# Patient Record
Sex: Female | Born: 1955
Health system: Southern US, Community
[De-identification: ages and names within clinical notes are randomized; demographics above are authoritative.]

## PROBLEM LIST (undated history)

## (undated) DIAGNOSIS — I499 Cardiac arrhythmia, unspecified: Secondary | ICD-10-CM

## (undated) DIAGNOSIS — K219 Gastro-esophageal reflux disease without esophagitis: Secondary | ICD-10-CM

## (undated) DIAGNOSIS — F329 Major depressive disorder, single episode, unspecified: Secondary | ICD-10-CM

## (undated) DIAGNOSIS — Z9889 Other specified postprocedural states: Secondary | ICD-10-CM

## (undated) DIAGNOSIS — F410 Panic disorder [episodic paroxysmal anxiety] without agoraphobia: Secondary | ICD-10-CM

## (undated) DIAGNOSIS — E782 Mixed hyperlipidemia: Secondary | ICD-10-CM

## (undated) DIAGNOSIS — T8859XA Other complications of anesthesia, initial encounter: Secondary | ICD-10-CM

## (undated) DIAGNOSIS — R112 Nausea with vomiting, unspecified: Secondary | ICD-10-CM

## (undated) DIAGNOSIS — T4145XA Adverse effect of unspecified anesthetic, initial encounter: Secondary | ICD-10-CM

## (undated) DIAGNOSIS — D6861 Antiphospholipid syndrome: Secondary | ICD-10-CM

## (undated) DIAGNOSIS — I4891 Unspecified atrial fibrillation: Secondary | ICD-10-CM

## (undated) DIAGNOSIS — M159 Polyosteoarthritis, unspecified: Secondary | ICD-10-CM

## (undated) DIAGNOSIS — I480 Paroxysmal atrial fibrillation: Secondary | ICD-10-CM

## (undated) HISTORY — DX: Unspecified atrial fibrillation: I48.91

## (undated) HISTORY — DX: Gastro-esophageal reflux disease without esophagitis: K21.9

## (undated) HISTORY — DX: Mixed hyperlipidemia: E78.2

## (undated) HISTORY — DX: Paroxysmal atrial fibrillation: I48.0

## (undated) HISTORY — PX: TONSILLECTOMY: SUR1361

## (undated) HISTORY — DX: Polyosteoarthritis, unspecified: M15.9

## (undated) HISTORY — DX: Major depressive disorder, single episode, unspecified: F32.9

## (undated) HISTORY — DX: Panic disorder (episodic paroxysmal anxiety): F41.0

## (undated) HISTORY — PX: DIAGNOSTIC LAPAROSCOPY: SUR761

## (undated) HISTORY — PX: TOTAL ABDOMINAL HYSTERECTOMY W/ BILATERAL SALPINGOOPHORECTOMY: SHX83

## (undated) HISTORY — DX: Antiphospholipid syndrome: D68.61

---

## 1999-02-24 ENCOUNTER — Ambulatory Visit (HOSPITAL_COMMUNITY): Admission: AD | Admit: 1999-02-24 | Discharge: 1999-02-24 | Payer: Self-pay | Admitting: *Deleted

## 1999-02-25 ENCOUNTER — Encounter (INDEPENDENT_AMBULATORY_CARE_PROVIDER_SITE_OTHER): Payer: Self-pay | Admitting: Specialist

## 1999-04-06 ENCOUNTER — Other Ambulatory Visit: Admission: RE | Admit: 1999-04-06 | Discharge: 1999-04-06 | Payer: Self-pay | Admitting: *Deleted

## 1999-05-08 ENCOUNTER — Encounter: Payer: Self-pay | Admitting: Family Medicine

## 1999-05-08 ENCOUNTER — Encounter: Admission: RE | Admit: 1999-05-08 | Discharge: 1999-05-08 | Payer: Self-pay | Admitting: Family Medicine

## 1999-12-25 ENCOUNTER — Emergency Department (HOSPITAL_COMMUNITY): Admission: EM | Admit: 1999-12-25 | Discharge: 1999-12-25 | Payer: Self-pay | Admitting: Emergency Medicine

## 2000-04-04 ENCOUNTER — Encounter: Payer: Self-pay | Admitting: Family Medicine

## 2000-04-04 ENCOUNTER — Encounter: Admission: RE | Admit: 2000-04-04 | Discharge: 2000-04-04 | Payer: Self-pay | Admitting: Family Medicine

## 2000-05-23 ENCOUNTER — Encounter: Payer: Self-pay | Admitting: Family Medicine

## 2000-05-23 ENCOUNTER — Encounter: Admission: RE | Admit: 2000-05-23 | Discharge: 2000-05-23 | Payer: Self-pay | Admitting: Family Medicine

## 2000-07-04 ENCOUNTER — Ambulatory Visit (HOSPITAL_COMMUNITY): Admission: RE | Admit: 2000-07-04 | Discharge: 2000-07-04 | Payer: Self-pay | Admitting: Neurology

## 2000-08-06 ENCOUNTER — Ambulatory Visit (HOSPITAL_COMMUNITY): Admission: RE | Admit: 2000-08-06 | Discharge: 2000-08-06 | Payer: Self-pay | Admitting: *Deleted

## 2000-11-04 ENCOUNTER — Encounter: Payer: Self-pay | Admitting: Neurology

## 2000-11-04 ENCOUNTER — Encounter: Admission: RE | Admit: 2000-11-04 | Discharge: 2000-11-04 | Payer: Self-pay | Admitting: Neurology

## 2001-03-18 ENCOUNTER — Encounter: Payer: Self-pay | Admitting: Neurology

## 2001-03-18 ENCOUNTER — Encounter: Admission: RE | Admit: 2001-03-18 | Discharge: 2001-03-18 | Payer: Self-pay | Admitting: Neurology

## 2001-04-07 ENCOUNTER — Encounter: Admission: RE | Admit: 2001-04-07 | Discharge: 2001-04-07 | Payer: Self-pay | Admitting: Family Medicine

## 2001-04-07 ENCOUNTER — Encounter: Payer: Self-pay | Admitting: Family Medicine

## 2001-04-07 ENCOUNTER — Other Ambulatory Visit: Admission: RE | Admit: 2001-04-07 | Discharge: 2001-04-07 | Payer: Self-pay | Admitting: Family Medicine

## 2003-02-04 ENCOUNTER — Encounter: Admission: RE | Admit: 2003-02-04 | Discharge: 2003-02-04 | Payer: Self-pay | Admitting: Family Medicine

## 2003-09-27 ENCOUNTER — Ambulatory Visit (HOSPITAL_COMMUNITY): Admission: RE | Admit: 2003-09-27 | Discharge: 2003-09-27 | Payer: Self-pay | Admitting: Obstetrics and Gynecology

## 2003-09-27 ENCOUNTER — Encounter (INDEPENDENT_AMBULATORY_CARE_PROVIDER_SITE_OTHER): Payer: Self-pay | Admitting: *Deleted

## 2004-02-07 ENCOUNTER — Encounter: Admission: RE | Admit: 2004-02-07 | Discharge: 2004-02-07 | Payer: Self-pay | Admitting: Family Medicine

## 2004-06-19 ENCOUNTER — Encounter (INDEPENDENT_AMBULATORY_CARE_PROVIDER_SITE_OTHER): Payer: Self-pay | Admitting: *Deleted

## 2004-06-19 ENCOUNTER — Inpatient Hospital Stay (HOSPITAL_COMMUNITY): Admission: RE | Admit: 2004-06-19 | Discharge: 2004-06-21 | Payer: Self-pay | Admitting: Obstetrics and Gynecology

## 2005-03-07 ENCOUNTER — Encounter: Admission: RE | Admit: 2005-03-07 | Discharge: 2005-03-07 | Payer: Self-pay | Admitting: Family Medicine

## 2006-04-08 ENCOUNTER — Encounter: Admission: RE | Admit: 2006-04-08 | Discharge: 2006-04-08 | Payer: Self-pay | Admitting: Family Medicine

## 2008-03-16 ENCOUNTER — Encounter: Admission: RE | Admit: 2008-03-16 | Discharge: 2008-03-16 | Payer: Self-pay | Admitting: Family Medicine

## 2009-10-10 ENCOUNTER — Encounter: Admission: RE | Admit: 2009-10-10 | Discharge: 2009-10-10 | Payer: Self-pay | Admitting: Family Medicine

## 2010-07-07 NOTE — H&P (Signed)
[place]. [facility]  Patient:    Catherine [name], Catherine [name]                       MRN: [id number] Adm. Date:  [id number] Disc. Date: [id number] Attending:  Fenton [name] Dictator:   Catherine [name], N.P. CC:         Catherine [name], M.D.  Catherine [name], M.D.   History and Physical  PRIMARY CARE Catherine [name]:  Catherine [name], M.D.  DATE OF BIRTH:  October 07, 1955  IMPRESSION:  (As dictated by Dr. Hillary [name]) 1. Atypical chest pain in this 55 year old female with minimal risk factors    for coronary artery disease.  Followup stress Cardiolite July 31, 2000 was    suspicious for ischemia, anterior/anteroapical region, which may or may not    be related to a smaller Cardiolite dose she had had at rest.  She had    normal wall motion with ejection fraction of 57%.  Test results were    discussed with the patient and it was elected to proceed for coronary    angiography to ascertain if events of coronary artery disease as etiology    for discomfort. 2. Neuropathy; followed by Dr. Thad [name].  Uncertain etiology. 3. History of gastroesophageal reflux disease.  PLAN:  (As dictated by Dr. Hillary [name]) Coronary angiography, possible ______ intervention if indicated and able.  Risks, potential complications, benefits, and alternatives to procedure were discussed in detail.  Ms. Catherine [name] indicates her questions and concerns have been addressed and is agreeable to proceed.  HISTORY OF PRESENT ILLNESS:  Ms. Catherine [name] is a very pleasant 55 year old female with an approximately three-week history of left anterior, sharp, pinprick chest discomfort not particularly associated with exertion.  She had a single episode of feeling of facial fullness quickly extending down to her toes the evening of May 28 with associated near-syncopal feeling with subsequent anterior chest squeezing when she woke up the next morning.  Followup office evaluation with Dr. Fraser [name] May 29  resulting in referral for stress Cardiolite, which was suspicious for anterior/anteroapical ischemia, though this may or may not be related to a smaller Cardiolite dose she had received at rest.   All things considered, and after discussion with the patient, it was decided to proceed for coronary angiography.  PAST SURGICAL HISTORY:  Fibroids removed February 2001.  MEDICATIONS:  Neurontin 100 mg p.o. q.d. for the last two days with intention to titrate up per Dr. Thad [name].  She discontinued her klonpin about three weeks earlier secondary to thinking that this may be causing some weakness.  ALLERGIES:  CIPRO, CODEINE, and more recently, LACTOSE intolerance.  No problems with seafood, shellfish, no with ______.  SOCIAL HISTORY/HABITS:  Tobacco:  Negative.  ETOH:  Negative.  The patient is married.  Her husband is currently in prison since 1988.  This is her second marriage.  She has a 28 year old daughter.  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  FAMILY HISTORY:  Two brothers with hypertension and one with dyslipidemia. Parents without CAD.  REVIEW OF SYSTEMS:  As in the HPI/past medical history.  Otherwise, significant for fatigue and insomnia, ringing in her ears, noting more shortness of breath over the last two weeks exacerbated with recumbency.  She is undergoing current workup by a neurologist for generalized weakness and tingling; workup including possible etiology related to multiple sclerosis. The patient denies history of hypertension, dyslipidemia, diabetes, cancer, peptic  ulcer disease.  She does have problems with GERD when eating acidic or spicy foods or drinks.  Feelings of a lot of belching.  Denies melena, bright red blood PR, constipation or diarrhea.  Negative dysuria nor hematuria.  PHYSICAL EXAMINATION:  (As performed by Dr. Hillary [name])   VITAL SIGNS:  Blood pressure 104/72, heart rate 91 and regular, respiratory rate 15, temperature  97.3.  GENERAL:  She is a well-nourished, slender 55 year old female in no acute distress.  Her mother is in attendance today.  The patients height is 5 feet 6 inches, weight 140 pounds.  HEENT:  Brisk bilateral carotid upstroke without bruits.  NECK:  No JVD, no thyromegaly.  CHEST:  Lung sounds clear with equal bilateral excursion.  Negative CPA tenderness.  CARDIAC:  Regular rate and rhythm without murmur, rub, nor gallop.  Normal S1 and S2.  ABDOMEN:  Soft, nondistended, normoactive bowel sounds.  Negative abdominal aorta, renal, nor femoral bruits.  EXTREMITIES:  Distal pulses intact.  Negative pedal edema.  NEUROLOGIC:  Cranial nerves II-XII grossly intact.  Alert and oriented x 3.  GENITALIA/RECTUM:  Deferred.  LABORATORY DATA:  Negative.  From August 02, 2000:  Glucose 99, BUN 11, creatinine 0.8, sodium 140, potassium 4.0, chloride 103, CO2 27, calcium 9.1. LFTs within normal range.  Hemoglobin 12.8, hematocrit 37.1, platelets 319, WBC 4.8.  Coags:  Pro time 11.5 with INR of 0.95.  PTT of 29.  Stress Cardiolite from July 31, 2000 revealed reversed distribution in the anterior/anteroapical region, which may be related to smaller Cardiolite dose at rest.  Normal LV function with EF of 57%.  EKG from June 12 revealed NSR at 95 beats per minute without ischemic changes. DD:  08/06/00 TD:  08/06/00 Job: [id number]

## 2010-07-07 NOTE — Procedures (Signed)
[place]. [facility]  Patient:    REMINGTYN, DEPAOLA                       MRN: [id number] Proc. Date: 07/04/00 Adm. Date:  [id number] Attending:  Fenton Malling                           Procedure Report  PROCEDURE PERFORMED:  Diagnostic lumbar puncture.  OPERATOR:  Kelli Hope, M.D.  INDICATIONS FOR PROCEDURE:  Rule out multiple sclerosis.  DESCRIPTION OF PROCEDURE:  Informed consent was obtained after indications, risks, benefits and options were discussed with the patient and she agreed to proceed.  The patient was placed in the right lateral decubitus position and prepped and draped in the usual sterile fashion.  Local anesthesia was achieved with 2 cc of lidocaine.  A 20 gauge spinal needle was inserted into the L3-4 interspace and advanced until clear CSF was returned.  Opening pressure was measured at 165 mmH2O.  Approximately 8 tubes of spinal fluid were drawn off and sent for the following studies.  (1) Cell count and differential, (2) glucose and protein (3) immunoelectrophoresis and oligoclonal bands (4) hold.  Closing pressure was measured at 145 ccH2O. Needle was withdrawn.  Hemostasis was obtained.  No immediate complications were noted.  Patient was advised to lie flat for one hour prior to discharge. One red tube of blood will be sent the CSF to the lab for oligoclonal testing. DD:  07/04/00 TD:  07/05/00 Job: [id number]

## 2010-07-07 NOTE — H&P (Signed)
Catherine [name], Catherine [name]                ACCOUNT NO.:  [id number]   MEDICAL RECORD NO.:  [id number]          PATIENT TYPE:  INP   LOCATION:  NA                            [facility]   PHYSICIAN:  Charles A. Delcambre, MDDATE OF BIRTH:  11-28-1955   DATE OF ADMISSION:  DATE OF DISCHARGE:                                HISTORY & PHYSICAL   REASON FOR ADMISSION:  To undergo transabdominal hysterectomy and bilateral  salpingo-oophorectomy for recurrent endometrial polyps and abnormal uterine  bleeding.  Endometrial polyps have been hyperplastic without atypia but have  been recurrent very rapidly after hysteroscopic resection of the polyps  noted to be complete with hysteroscopy.  Hysteroscopic resection was done on  09/2003 and now further workup for abnormal bleeding once again in 04/2004  has noted multiple polyps to have reoccurred.  She is now to be admitted to  undergo definitive therapy with this history of hyperplastic polyps.  She is  a 55 year old, para 1-0-0-1, LMP one week ago, periods very heavy lasting 7-  8 days trying to cycle on Prometrium but failing this, still being irregular  and heavy.  She gives informed consent except for some infection, bleeding,  bowel or bladder damage, ureteral damage, blood product risks including  hepatitis and HIV exposure, DVT risks, anesthesia risks, and general  incisional infection as well as fistula formation.  All questions were  answered.  We will proceed as outlined.   PAST MEDICAL HISTORY:  Anxiety, antiphospholipid syndrome, TIA versus MS  versus anxiety with lesion seen on her brain on MRI but symptoms of nerve  shaking in her extremities are all much better on Lexapro.   PAST SURGICAL HISTORY:  History of fibroid removal hysteroscopically in  2000, bilateral tubal ligation, tonsils and adenoids.   MEDICATIONS:  Lexapro 10 mg once a day, Prometrium discontinued at this  time, baby aspirin 81 mg once that was discontinued one  week ago.   ALLERGIES:  Codeine and ciprofloxacin reaction not specified.   SOCIAL HISTORY:  No tobacco, ethanol or drug use.  The patient is married,  in a monogamous relationship with her husband.   FAMILY HISTORY:  Hypercholesterolemia in her father, brother with  hypercholesterolemia and hypertension also, a secondary brother with  hypertension, otherwise negative family history.   REVIEW OF SYSTEMS:  Denies fevers or chills, rashes, lesions, headaches,  dizziness, chest pain, shortness of breath, wheezing, diarrhea,  constipation, bleeding, melena, hematochezia, urgency, frequency, dysuria,  incontinence, hematuria, galacturia, emotional changes.   PHYSICAL EXAMINATION:  Alert and oriented x3, in no distress.  Blood  pressure 100/64, heart rate 64, weight 145 pounds.  HEENT:  Exam grossly  within normal limits.  NECK:  Supple, without thyromegaly.  No adenopathy.  LUNGS:  Clear bilaterally.  BACK:  No CVAT.  HEART:  Regular rate and rhythm  without murmur, rub or gallop.  BREASTS:  Symmetrical, otherwise not  examined at this time.  ABDOMEN:  Soft, flat, non-tender.  No  hepatosplenomegaly.  PELVIC:  Normal external female genitalia.  Bartholin,  urethral, Skene glands normal.  Vagina without discharge  or lesions.  Normal  cervix.  Uterus is 8-10 weeks size, mobile, and non-tender.  Adnexa non-  tender without masses bilaterally.  EXTREMITIES:  Non-tender.  Anus and  peritoneal body appear normal.  RECTAL:  Exam is not done.   ASSESSMENT:  Recurrent endometrial polyps.   PLAN:  Transabdominal hysterectomy, bilateral salpingo-oophorectomy with  inspection of the pelvis carefully.  Preoperative CBC, chem-20, type and screen, Cefotan or equivalent 1 g on  call to the OR, knee high SCDs.  Plan Lovenox 24 hours after surgery for 3-4  days until fully mobile, then restart one aspirin per day secondary to  antiphospholipid syndrome. All questions were answered and will proceed as   outlined.      CAD/MEDQ  D:  06/14/2004  T:  06/14/2004  Job:  [id number]

## 2010-07-07 NOTE — H&P (Signed)
Catherine [name], [name]                          ACCOUNT NO.:  [id number]   MEDICAL RECORD NO.:  [id number]                   PATIENT TYPE:  AMB   LOCATION:  SDC                                  [facility]   PHYSICIAN:  Charles A. Sydnee Cabal, MD            DATE OF BIRTH:  1955-06-12   DATE OF ADMISSION:  09/13/2003  DATE OF DISCHARGE:                                HISTORY & PHYSICAL   The patient comes in to be admitted today for hysteroscopy and dilation and  curettage for a non endometrial polyp and menorrhagia.  A 55 year old para 1-  0-0-1, LMP irregular Jul 15, 2003, very heavy.  It is lasting three weeks,  then nothing for a month and then lasting two weeks and then nothing for a  month, skipping a month, etc.  Endometrial biopsy was done, benign, June of  2005.  Hemoglobin at this time 11.0, hematocrit 32.9, platelet count  379,000. Sonohistogram was done showing an endometrial polyp just under 2  cm.  This done August 27, 2003.  She is then to be admitted to undergo  hysteroscopy and dilation and curettage.  She accepts the risks of  infection, bleeding, bowel and bladder damage, blood product  risks  including hepatitis and HIV exposure, uterine perforation risks including  overload risks.   PAST MEDICAL HISTORY:  1. Anxiety.  2. Antiphospholipid syndrome.  3. ?  Transient ischemic attack versus MS versus anxiety with lesions on     her brain and symptoms of nerve shaking in extremities.   PAST SURGICAL HISTORY:  1. History of fibroid removal hysteroscopically in 2000.  2. Bilateral tubal ligation.  3. Tonsils and adenoids.   ADMISSION MEDICATIONS:  1. Lexapro 10 mg a day.  2. Baby aspirin 80 mg a day, discontinue today.   ALLERGIES:  CODEINE and CIPROFLOXIN.  Reaction not specified.   SOCIAL HISTORY:  Denies tobacco, ethanol or drug use, or STD exposure in the  past.  The patient is married in monogamous relationship with her husband.   FAMILY HISTORY:   Hypercholesterolemia in her father.  One brother has  hypercholesterolemia and hypertension also.  A second brother has  hypertension.  Otherwise negative family history.   REVIEW OF SYSTEMS:  She does complain of menorrhagia.  Otherwise no fevers,  chills, rashes, lesions, headaches, dizziness, chest pain, shortness of  breath, wheezing, diarrhea, constipation, bleeding, melena, hematochezia,  urgency, frequency, dysuria, incontinence, hematuria, galactorrhea,  emotional changes.   PHYSICAL EXAMINATION:  VITAL SIGNS:  Blood pressure 114/70, weight 139  pounds, height 5 feet 6 inches, respiratory rate 16, pulse 90, afebrile,  GENERAL:  Alert and oriented times 3, no distress.  HEENT:  Grossly within normal limits.  NECK:  Supple without thyromegaly or adenopathy.  LUNGS:  Clear bilaterally.  HEART:  Regular rate and rhythm without murmurs, rubs or gallops.  BREASTS:  Deferred.  Normal breast exam with PCP  recently per patient's  history.  ABDOMEN:  Soft, flat, nontender, no hepatosplenomegaly or other masses  noted.  PELVIC:  Normal external female genitalia.  Bartholins, urethra and Skenes  were within normal limits.  Vault without discharge or lesions.  At time of  biopsy, sound was to 8 cm.  Bimanual examination uterus retroflexed, non  enlarged, mobile and nontender.  Adnexa were nontender without masses,  bilateral ovaries were felt to be normal size bilaterally.   ASSESSMENT:  1. Menorrhagia.  2. Endometrial polyp.   PLAN:  Hysteroscopy and D&C as noted above.   PREOP:  Serum  HCG, CBC, n.p.o. past 0700.  Clears up until that time and  will proceed as outlined.                                               Charles A. Sydnee Cabal, MD    CAD/MEDQ  D:  09/13/2003  T:  09/13/2003  Job:  [id number]

## 2010-07-07 NOTE — Cardiovascular Report (Signed)
[place]. [facility]  Patient:    Catherine [name], Catherine [name]                       MRN: [id number] Adm. Date:  [id number] Disc. Date: [id number] Attending:  Fenton Malling CC:         Desma Maxim, M.D.   Cardiac Catheterization  REFERRING PHYSICIAN:  Desma Maxim, M.D.  INDICATIONS:  This is a 55 year old female with ongoing chest pain associated with palpitations and tachy arrhythmias.  Stress test was performed.  The patient exercised to 7 minutes and 24 seconds utilizing full Bruce protocol.  Baseline ECG revealed normal sinus rhythm, with T-wave inversion in the inferolateral lead.  ECG at peak revealed sinus tachycardia with no inducible ischemia.  Cardiolite revealed reversary distribution in the anterior interior apical region, which was felt to be secondary to breast attenuation and a small dose used on rest images.  The _____ study revealed normal wall motion with an ejection fraction of 57%. These findings were discussed with the patient.  She continued to have ongoing chest pain and wished to have coronary angiography.  PROCEDURE:  After obtaining written informed consent, the patient was brought to the cardiac catheterization laboratory in the postabsorptive state. Preoperative sedation was achieved using Valium p.o.  The right groin was prepped and draped in the usual sterile fashion.  Local anesthesia was achieved using 1% Xylocaine.  IV sedation was performed using Versed 2 mg IV.  A 6-French hemostasis sheath was placed into the right femoral artery using the modified Seldinger technique.  Selective coronary angiography was performed using JL-4 and JR-5 Judkins catheters.  Multiple views of the left system were obtained.  A single view of the right system was obtained secondary to a small codominant artery and significant dampening with engagement.  All catheter exchanges were made over a guide wire.  The hemostasis sheath was  flushed following each engagement.  FINDINGS: The aortic pressure is 110/71.  LV pressure is 111/8.  EDP is 18.  Single-plane ventriculogram revealed normal wall motion with an ejection fraction of 65%.  There was no mitral regurgitation noted.  CORONARY ANGIOGRAPHY: The left main coronary artery was short and bifurcated into the left anterior descending and circumflex.  There was no significant disease in the left main coronary artery.  The left anterior descending gave rise to a large D-1, large D-2, small D-3, and ended as an apical recurrent branch.  There was no significant disease in the left anterior descending or its branches.  The circumflex vessel was large and codominant.  It gave rise to a large OM-1, small OM-2, large OM-3, and a PDA branch.  There was no significant disease in the circumflex or its branches.  The right coronary artery was small and codominant.  It gave rise to an RV marginal #1 and RV marginal #2.  IMPRESSION: 1. Normal coronary angiography. 2. Normal single-plane ventriculogram.  RECOMMENDATION:  Consider other etiologies for her chest pain. DD:  08/06/00 TD:  08/06/00 Job: [id number]

## 2010-07-07 NOTE — Op Note (Signed)
[name], [name]                ACCOUNT NO.:  [id number]   MEDICAL RECORD NO.:  [id number]          PATIENT TYPE:  INP   LOCATION:  9399                          [facility]ILITY:  [facility]   PHYSICIAN:  Charles A. Delcambre, MDDATE OF BIRTH:  1955/12/10   DATE OF PROCEDURE:  06/19/2004  DATE OF DISCHARGE:                                 OPERATIVE REPORT   PREOPERATIVE DIAGNOSIS:  Endometrial hyperplastic polyps, recurrent.   POSTOPERATIVE DIAGNOSIS:  Endometrial hyperplastic polyps, recurrent.   OPERATION/PROCEDURE:  1.  Transabdominal hysterectomy.  2.  Bilateral salpingo-oophorectomy.   SURGEON:  Charles A. Sydnee Cabal, M.D.   ASSISTANT:  Artist Pais, M.D.   COMPLICATIONS:  None.   ESTIMATED BLOOD LOSS:  700 mL.   SPECIMENS:  Uterus, tubes and ovaries to pathology.   COUNTS:  Instrument, sponge and needle counts correct x2.   ANESTHESIA:  General endotracheal anesthesia.   OPERATIVE FINDINGS:  Some pelvic adhesive disease with bowel adherent to the  posterior aspect of the uterus and to the left tube and ovary in the pelvis.  Large bowel adhesions.  Some omental adhesions to the adnexal regions  consistent with tubal ligation in the past.   DESCRIPTION OF PROCEDURE:  The patient was taken to the operating room and  placed in the supine position.  General anesthetic was induced without  difficulty.  Sterile prep and drape was undertaken.  A Pfannenstiel incision  was made with the knife, carried down to the fascia.  The fascia was incised  with the knife and Mayo scissors and released superiorly and inferiorly  sharply.  Rectus muscles were sharply dissected in the midline.  The  peritoneum was entered with Metzenbaum scissors without damage to bowel and  bladder or vascular structures.  Peritoneum incision was extended.  Balfour  retractor was placed. Moistened laps were used to pack the bowel under the  pelvis.  Kelly clamps were used to grasp the uterus.  Round ligaments  were  transected and transfixed stitch of 0 Vicryl and held.  Bladder was dropped  down across the lower uterine segment bluntly and sharply, after incising  the vesicouterine peritoneum. Broad ligament was opened.  Ureters were seen  and well cleared with the infundibulum pedicle. These pedicles were taken,  free-tied and transfixing stitched with 0 Vicryl.  Hemostasis was excellent.  Uterine vessels were skeletonized after the bladder was taken down sharply  and bluntly.  Uterine vessels were simply  tied bilaterally.  The remaining  pedicles for the cardinal ligaments were taken down on either side.  A  moderate amount of bleeding was encountered.  With sharp dissection of the  bowel off the posterior aspect of the uterus and infundibulum pedicle and  skeletonized the uterine vessels on the left from the adhesions, accounting  for most of the blood loss.  Adequate capture of the bleeding was undertaken  and further pedicles were taken carefully without going lateral.  Final  pedicles on either side included vaginal angle and the uterosacral  ligaments.  These were transfixed with stay sutures and held.  Cervix was  amputated from  the vaginal cuff and Richardson angle sutures were placed  with 0 Vicryl and suspended to the uterosacral ligament.  Vaginal cuff was  then closed with 0 Vicryl running locking suture with good hemostasis  resulting.  Irrigation was carried out.  Bleeding area on the left  peritoneal edge at the cardinal ligament pedicle was stitched with figure-of-  eight suture superficially.  Hemostasis was excellent.  This again counted  again for approximately another 100 mL of blood loss.  Hemostasis was  verified to be excellent after irrigation.  Small bleeder at the right  uterosacral ligament was figure-of-eight sutured with 2-0 Vicryl achieving  adequate hemostasis.  This again was another 50 mL approximately of blood  loss.  Irrigation was carried out in all areas  with good hemostasis.  Laps  were removed.  Balfour retractor was removed.  Subfascial hemostasis was  excellent.  Fascia was closed with #1 Vicryl running non-locking suture.  Subcutaneous tissue hemostasis was excellent after minor electrocautery.  Sterile skin clips were used to closet skin.  Sterile dressing was applied.  The patient was taken to recovery in good condition,  having tolerated the  procedure well.      CAD/MEDQ  D:  06/19/2004  T:  06/19/2004  Job:  [id number]

## 2010-07-07 NOTE — Discharge Summary (Signed)
NAMEWINNI, EHRHARD                ACCOUNT NO.:  [id number]   MEDICAL RECORD NO.:  [id number]          PATIENT TYPE:  INP   LOCATION:  9316                          [facility]   PHYSICIAN:  Charles A. Delcambre, MDDATE OF BIRTH:  03/13/1955   DATE OF ADMISSION:  06/19/2004  DATE OF DISCHARGE:  06/21/2004                                 DISCHARGE SUMMARY   PRIMARY DISCHARGE DIAGNOSES:  1.  Benign endometrial polyps.  2.  Adenomyosis.  3.  Uterine leiomyomata.  4.  Hyperplastic polyps, recurrent short-term.  5.  Antiphospholipid syndrome.   PROCEDURE:  Transabdominal hysterectomy, bilateral salpingo-oophorectomy.   DISPOSITION:  The patient discharged home to follow up in the office in 2  days to have staples discontinued. She was to convalesce at home, notify of  temperature greater than 100 degrees, erythema or drainage from the  incision. No lifting greater than 25 pounds for 4 weeks, no driving for 2  weeks, shower for 2 weeks. She was given prescription Lovenox 40 mg to use  subcutaneously for 2 days and then go to aspirin once a day per PCP. Also,  Darvocet-N 100 one to two p.o. q.4h. p.r.n. #40 and Chromagen one p.o.  daily.   LABORATORY:  Postoperative hematocrit 26.2, hemoglobin 9.1.   HISTORY AND PHYSICAL:  As dictated on the chart.   HOSPITAL COURSE:  The patient was admitted and underwent surgery as noted  above. Postoperatively, the patient had no complications. She has  spontaneous flatus return postoperative day #1 and was given general diet.  She voided without difficulty after Foley catheter was discontinued  postoperative day #1. She ambulated without difficulty and as she was doing  well, Lovenox was started postoperative day #2 and she was discharged home  on postoperative day #2 with follow-up as noted above.      CAD/MEDQ  D:  07/20/2004  T:  07/20/2004  Job:  [id number]

## 2010-07-07 NOTE — Op Note (Signed)
Catherine [name], Catherine [name]                          ACCOUNT NO.:  [id number]   MEDICAL RECORD NO.:  [id number]                   PATIENT TYPE:  AMB   LOCATION:  SDC                                  FACILITY:  [facility]   PHYSICIAN:  Charles A. Sydnee Cabal, MD            DATE OF BIRTH:  1955-06-01   DATE OF PROCEDURE:  09/27/2003  DATE OF DISCHARGE:                                 OPERATIVE REPORT   PREOPERATIVE DIAGNOSE:  1. Abnormal uterine bleeding/menorrhagia.  2. Endometrial polyp.   POSTOPERATIVE DIAGNOSES:  1. Abnormal uterine bleeding/menorrhagia.  2. Endometrial polyp.   PROCEDURE:  1. Paracervical block.  2. Hysteroscopy.  3. Dilation and curettage.  4. Hysteroscopic polypectomy.   SURGEON:  Charles A. Delcambre, MD   COMPLICATIONS:  None,.   ESTIMATED BLOOD LOSS:  Less than or equal to 25 mL.   INS AND OUTS:  Loss of uterine distention medium sorbitol 20 mL.   COUNTS:  Instrument, sponge and needle count correct x2.   ANESTHESIA:  Monitored anesthesia care, IV sedation.   OPERATIVE FINDINGS:  Small fundal endometrial polyp.   SPECIMENS:  1. Endometrial polyps.  2. Endometrial curettings.   DESCRIPTION OF PROCEDURE:  The patient was taken to the operating room and  placed in the supine position.  Anesthesia was induced.  The dorsal  lithotomy position in the universal stirrups was undertaken.  The cervix was  grasped with a single-tooth tenaculum.  Paracervical block was placed,  divided equally at 4 and 8 o'clock.  Next, 0.25% plain Marcaine, 18 mL,  divided equally.  Hegar dilators were used to dilate her enough to pass the  small 5 mm hysteroscope.  Findings are as noted above.  Under great  visualization, the polyp was removed with banjo  forceps and visualization of the hysteroscope verifying.  Generous  curettings were then undertaken.  There was no evidence of perforation.  The  patient tolerated the procedure well.  All instruments were removed.  The  patient  was taken to the recovery with the assistant in attendance.                                               Charles A. Sydnee Cabal, MD    CAD/MEDQ  D:  09/27/2003  T:  09/27/2003  Job:  [id number]

## 2010-12-29 ENCOUNTER — Other Ambulatory Visit: Payer: Self-pay | Admitting: Family Medicine

## 2010-12-29 DIAGNOSIS — Z1231 Encounter for screening mammogram for malignant neoplasm of breast: Secondary | ICD-10-CM

## 2011-01-24 ENCOUNTER — Ambulatory Visit
Admission: RE | Admit: 2011-01-24 | Discharge: 2011-01-24 | Disposition: A | Discharge status: Home / Self Care | Payer: BC Managed Care – PPO | Source: Ambulatory Visit | Attending: Family Medicine | Admitting: Family Medicine

## 2011-01-24 DIAGNOSIS — Z1231 Encounter for screening mammogram for malignant neoplasm of breast: Secondary | ICD-10-CM

## 2012-03-04 ENCOUNTER — Other Ambulatory Visit: Payer: Self-pay | Admitting: Family Medicine

## 2012-03-04 DIAGNOSIS — Z1231 Encounter for screening mammogram for malignant neoplasm of breast: Secondary | ICD-10-CM

## 2012-03-27 ENCOUNTER — Ambulatory Visit
Admission: RE | Admit: 2012-03-27 | Discharge: 2012-03-27 | Disposition: A | Discharge status: Home / Self Care | Payer: BC Managed Care – PPO | Source: Ambulatory Visit | Attending: Family Medicine | Admitting: Family Medicine

## 2012-03-27 DIAGNOSIS — Z1231 Encounter for screening mammogram for malignant neoplasm of breast: Secondary | ICD-10-CM

## 2012-12-12 ENCOUNTER — Telehealth: Payer: Self-pay | Admitting: Interventional Cardiology

## 2012-12-12 NOTE — Telephone Encounter (Signed)
New Problem:  Pt states she has a-fib and after her recent trip to [place] it has gotten worse. Pt would like to see Dr. Eldridge Dace asap. I scheduled the pt to come in 11/5. Pt would like to be sen sooner. Pt states she took an extra dose of the generic for toprolol 25mg . Pt states she plans on taking two until she sees the doctor.  Please advise

## 2012-12-12 NOTE — Telephone Encounter (Signed)
Per Dr. Eldridge Dace pt should continue the increased dosage of Metoprolol 50mg . Pt should be seen next week. Appt made for 12/16/12. Pt is aware and meds have been updated.

## 2012-12-15 ENCOUNTER — Encounter: Payer: Self-pay | Admitting: Interventional Cardiology

## 2012-12-15 MED ORDER — METOPROLOL SUCCINATE ER 50 MG PO TB24: 50.0000 mg | ORAL_TABLET | Freq: Every day | ORAL | Status: DC

## 2012-12-16 ENCOUNTER — Encounter: Payer: Self-pay | Admitting: Interventional Cardiology

## 2012-12-16 ENCOUNTER — Ambulatory Visit (INDEPENDENT_AMBULATORY_CARE_PROVIDER_SITE_OTHER): Payer: BC Managed Care – PPO | Admitting: Interventional Cardiology

## 2012-12-16 VITALS — BP 120/82 | HR 73 | Ht 65.0 in | Wt 180.0 lb

## 2012-12-16 DIAGNOSIS — I480 Paroxysmal atrial fibrillation: Secondary | ICD-10-CM | POA: Insufficient documentation

## 2012-12-16 DIAGNOSIS — I4891 Unspecified atrial fibrillation: Secondary | ICD-10-CM

## 2012-12-16 DIAGNOSIS — R002 Palpitations: Secondary | ICD-10-CM

## 2012-12-16 DIAGNOSIS — E782 Mixed hyperlipidemia: Secondary | ICD-10-CM

## 2012-12-16 DIAGNOSIS — R079 Chest pain, unspecified: Secondary | ICD-10-CM

## 2012-12-16 MED ORDER — METOPROLOL SUCCINATE ER 25 MG PO TB24: 25.0000 mg | ORAL_TABLET | Freq: Two times a day (BID) | ORAL | Status: DC

## 2012-12-16 NOTE — Progress Notes (Signed)
Patient ID: Catherine [name], female   DOB: [birth year], 56 y.o.   MRN: [id number]    [address] [place], [place]  [address] Phone: [phone] Fax:  [phone]  Date:  12/16/2012   ID:  Catherine [name], DOB 11-Jul-1955, MRN [id number]  PCP:  Cam Hai, CNM      History of Present Illness: Catherine [name] is a 57 y.o. female  who has PAF. SHe has had minimal AFib until 11/30/12. No exertional chest pain. SHe does not do much exercise. She only walks her dog. Aspirin tolerated well. Still has caffeine. Increased tea/coffee consumption, which can cause some palpitations. Atrial Fibrillation F/U:  c/o Palpitations rare.  Denies : Chest pain.  Dizziness.  Leg edema.  Orthopnea.  Shortness of breath.  Syncope.    Since 11/30/12, while going to [place], she has had more palpitations- episodes lasting hours at a time.  Her metoprolol was increased and sx are better but not gone completely.  She has dut out caffeine and sx have not resolved.  It is an intermittent pulse.  Not as severe as the past AFib.  No syncope.   Sx more present in the evening. She has had some CP ,  Sharp pains lasting seconds.  None with exertion.  Stress test in 2002 negative. Walks regularly wihtout problems.    Wt Readings from Last 3 Encounters:  12/16/12 180 lb (81.647 kg)     Past Medical History  Diagnosis Date  . Panic disorder   . Anti-phospholipid syndrome   . Atrial fibrillation     on asa 325  . PAF (paroxysmal atrial fibrillation)   . Mixed hyperlipidemia   . GERD (gastroesophageal reflux disease)   . Generalized osteoarthrosis, unspecified site   . Major depression     Current Outpatient Prescriptions  Medication Sig Dispense Refill  . aspirin 325 MG tablet Take 325 mg by mouth daily.      Marland Kitchen buPROPion (WELLBUTRIN XL) 150 MG 24 hr tablet Take one tablet daily      . buPROPion (ZYBAN) 150 MG 12 hr tablet Take 150 mg by mouth daily.      . cholecalciferol (VITAMIN D) 1000  UNITS tablet Take 1,000 Units by mouth daily.      Tery Sanfilippo Sodium (PHILLIPS STOOL SOFTENER PO) Take by mouth daily.      . fish oil-omega-3 fatty acids 1000 MG capsule Take 2 g by mouth daily.      . metoprolol succinate (TOPROL-XL) 25 MG 24 hr tablet Take 25 mg by mouth daily.      Marland Kitchen omeprazole (PRILOSEC OTC) 20 MG tablet Take 20 mg by mouth daily.      Marland Kitchen OVER THE COUNTER MEDICATION Take one tablet of calcium and magnesium daily      . simvastatin (ZOCOR) 5 MG tablet Take 5 mg by mouth at bedtime.      Marland Kitchen venlafaxine XR (EFFEXOR-XR) 75 MG 24 hr capsule Take 75 mg by mouth 2 (two) times daily.       No current facility-administered medications for this visit.    Allergies:    Allergies  Allergen Reactions  . Ciprofloxacin Hcl   . Codeine   . Dairy Aid [Lactase]     Burns throat and makes pt. Head feel stuffy with headache  . Nsaids   . Other     Patient is allergic to nuts, it gives her headache     Social History:  The patient  reports that she has never smoked. She does not have any smokeless tobacco history on file. She reports that she does not drink alcohol or use illicit drugs.   Family History:  The patient's family history includes Cancer in her father, maternal grandfather, and maternal grandmother; Diabetes in her maternal grandmother; Hyperlipidemia in her brother and brother; Hypertension in her brother and brother.   ROS:  Please see the history of present illness.  No nausea, vomiting.  No fevers, chills.  No focal weakness.  No dysuria. palpitations   All other systems reviewed and negative.   PHYSICAL EXAM: VS:  BP 120/82  Pulse 73  Ht 5\' 5"  (1.651 m)  Wt 180 lb (81.647 kg)  BMI 29.95 kg/m2  SpO2 99% Well nourished, well developed, in no acute distress HEENT: normal Neck: no JVD, no carotid bruits Cardiac:  normal S1, S2; RRR;  Lungs:  clear to auscultation bilaterally, no wheezing, rhonchi or rales Abd: soft, nontender, no hepatomegaly Ext: no  edema Skin: warm and dry Neuro:   no focal abnormalities noted  EKG:  NSR. No ST segment  ASSESSMENT AND PLAN:  1. Atrial fibrillation  Continue Metoprolol Succinate Tablet Extended Release 24 Hour, 50 MG, 1 tablet, Orally, Once a day, 90, Refills 3 Continue Aspirin Tablet, 325 mg, 1 tablet, Orally, Once a day Diagnostic Imaging:EKG Harward,Amy 10/09/2011 03:44:41 PM > Hava Massingale,JAY 10/09/2011 04:31:58 PM > NSR, nonspecific ST changes  currently in sinus rhythm. Worsening palpitations. Plan or lifewatch monitor.    2. Mixed hyperlipidemia  Continue Simvastatin Tablet, 5 MG, 1 tablet every evening, Orally, Once a day Lipids well controlled.    3. Chest pain: Atypical features.  No ischemia on prior stress in 2002.   Preventive Medicine  Adult topics discussed:  Exercise: 5 days a week, at least 30 minutes of aerobic exercise.      Signed, Fredric Mare, MD, [facility] 12/16/2012 10:48 AM and and and had date with his symptoms have any evidence of more H. poor in her

## 2012-12-16 NOTE — Patient Instructions (Signed)
Your physician has recommended that you wear an event monitor. Event monitors are medical devices that record the heart's electrical activity. Doctors most often [place] these monitors to diagnose arrhythmias. Arrhythmias are problems with the speed or rhythm of the heartbeat. The monitor is a small, portable device. You can wear one while you do your normal daily activities. This is usually used to diagnose what is causing palpitations/syncope (passing out).  Your physician recommends that you follow up as scheduled.

## 2012-12-19 ENCOUNTER — Encounter: Payer: Self-pay | Admitting: *Deleted

## 2012-12-19 ENCOUNTER — Encounter (INDEPENDENT_AMBULATORY_CARE_PROVIDER_SITE_OTHER): Payer: BC Managed Care – PPO

## 2012-12-19 DIAGNOSIS — I4891 Unspecified atrial fibrillation: Secondary | ICD-10-CM

## 2012-12-19 DIAGNOSIS — I480 Paroxysmal atrial fibrillation: Secondary | ICD-10-CM

## 2012-12-19 NOTE — Progress Notes (Signed)
Patient ID: Catherine [name], female   DOB: 09/04/1955, 57 y.o.   MRN: [id number] Lifewatch 30 day cardiac event monitor applied to patient.

## 2012-12-24 ENCOUNTER — Ambulatory Visit: Payer: BC Managed Care – PPO | Admitting: Interventional Cardiology

## 2013-01-30 ENCOUNTER — Telehealth: Payer: Self-pay | Admitting: Cardiology

## 2013-01-30 NOTE — Telephone Encounter (Signed)
lmtrc

## 2013-01-30 NOTE — Telephone Encounter (Addendum)
Dr. Hoyle Barr Interpretation: NSR, PAC's, PVC's occasionally.

## 2013-02-02 NOTE — Telephone Encounter (Signed)
Pt.notified

## 2013-04-09 ENCOUNTER — Other Ambulatory Visit: Payer: Self-pay | Admitting: Family Medicine

## 2013-04-09 DIAGNOSIS — Z1231 Encounter for screening mammogram for malignant neoplasm of breast: Secondary | ICD-10-CM

## 2013-04-21 ENCOUNTER — Ambulatory Visit: Payer: BC Managed Care – PPO

## 2013-04-29 ENCOUNTER — Ambulatory Visit
Admission: RE | Admit: 2013-04-29 | Discharge: 2013-04-29 | Disposition: A | Discharge status: Home / Self Care | Payer: BC Managed Care – PPO | Source: Ambulatory Visit | Attending: Family Medicine | Admitting: Family Medicine

## 2013-04-29 DIAGNOSIS — Z1231 Encounter for screening mammogram for malignant neoplasm of breast: Secondary | ICD-10-CM

## 2013-05-06 ENCOUNTER — Other Ambulatory Visit: Payer: Self-pay | Admitting: Interventional Cardiology

## 2013-05-27 ENCOUNTER — Other Ambulatory Visit: Payer: Self-pay | Admitting: Gastroenterology

## 2013-05-28 ENCOUNTER — Encounter (HOSPITAL_COMMUNITY): Payer: Self-pay | Admitting: Pharmacy Technician

## 2013-05-28 ENCOUNTER — Encounter (HOSPITAL_COMMUNITY): Payer: Self-pay | Admitting: *Deleted

## 2013-06-09 ENCOUNTER — Encounter (HOSPITAL_COMMUNITY): Payer: BC Managed Care – PPO | Admitting: Anesthesiology

## 2013-06-09 ENCOUNTER — Encounter (INDEPENDENT_AMBULATORY_CARE_PROVIDER_SITE_OTHER): Payer: Self-pay

## 2013-06-09 ENCOUNTER — Encounter (HOSPITAL_COMMUNITY): Payer: Self-pay

## 2013-06-09 ENCOUNTER — Ambulatory Visit (HOSPITAL_COMMUNITY): Payer: BC Managed Care – PPO | Admitting: Anesthesiology

## 2013-06-09 ENCOUNTER — Ambulatory Visit (HOSPITAL_COMMUNITY)
Admission: RE | Admit: 2013-06-09 | Discharge: 2013-06-09 | Disposition: A | Discharge status: Home / Self Care | Payer: BC Managed Care – PPO | Source: Ambulatory Visit | Attending: Gastroenterology | Admitting: Gastroenterology

## 2013-06-09 ENCOUNTER — Encounter (HOSPITAL_COMMUNITY)
Admission: RE | Disposition: A | Discharge status: Home / Self Care | Payer: Self-pay | Source: Ambulatory Visit | Attending: Gastroenterology

## 2013-06-09 DIAGNOSIS — E7800 Pure hypercholesterolemia, unspecified: Secondary | ICD-10-CM | POA: Diagnosis not present

## 2013-06-09 DIAGNOSIS — D472 Monoclonal gammopathy: Secondary | ICD-10-CM | POA: Diagnosis not present

## 2013-06-09 DIAGNOSIS — D126 Benign neoplasm of colon, unspecified: Secondary | ICD-10-CM | POA: Insufficient documentation

## 2013-06-09 DIAGNOSIS — K921 Melena: Secondary | ICD-10-CM | POA: Insufficient documentation

## 2013-06-09 DIAGNOSIS — R12 Heartburn: Secondary | ICD-10-CM | POA: Insufficient documentation

## 2013-06-09 DIAGNOSIS — D6859 Other primary thrombophilia: Secondary | ICD-10-CM | POA: Diagnosis not present

## 2013-06-09 DIAGNOSIS — Z7982 Long term (current) use of aspirin: Secondary | ICD-10-CM | POA: Insufficient documentation

## 2013-06-09 DIAGNOSIS — F410 Panic disorder [episodic paroxysmal anxiety] without agoraphobia: Secondary | ICD-10-CM | POA: Insufficient documentation

## 2013-06-09 HISTORY — PX: ESOPHAGOGASTRODUODENOSCOPY (EGD) WITH PROPOFOL: SHX5813

## 2013-06-09 HISTORY — DX: Cardiac arrhythmia, unspecified: I49.9

## 2013-06-09 HISTORY — DX: Other complications of anesthesia, initial encounter: T88.59XA

## 2013-06-09 HISTORY — DX: Adverse effect of unspecified anesthetic, initial encounter: T41.45XA

## 2013-06-09 HISTORY — DX: Other specified postprocedural states: Z98.890

## 2013-06-09 HISTORY — PX: COLONOSCOPY WITH PROPOFOL: SHX5780

## 2013-06-09 HISTORY — DX: Nausea with vomiting, unspecified: R11.2

## 2013-06-09 SURGERY — COLONOSCOPY WITH PROPOFOL
Anesthesia: Monitor Anesthesia Care

## 2013-06-09 MED ORDER — PROPOFOL 10 MG/ML IV BOLUS
INTRAVENOUS | Status: AC
  Filled 2013-06-09: qty 20

## 2013-06-09 MED ORDER — PROMETHAZINE HCL 25 MG/ML IJ SOLN: 6.2500 mg | INTRAMUSCULAR | Status: DC | PRN

## 2013-06-09 MED ORDER — PROPOFOL 10 MG/ML IV BOLUS
INTRAVENOUS | Status: DC | PRN
Start: 2013-06-09 — End: 2013-06-09
  Administered 2013-06-09: 40 mg via INTRAVENOUS

## 2013-06-09 MED ORDER — PROPOFOL INFUSION 10 MG/ML OPTIME
INTRAVENOUS | Status: DC | PRN
  Administered 2013-06-09: 120 ug/kg/min via INTRAVENOUS

## 2013-06-09 MED ORDER — LACTATED RINGERS IV SOLN
INTRAVENOUS | Status: DC
  Administered 2013-06-09: 1000 mL via INTRAVENOUS

## 2013-06-09 MED ORDER — SODIUM CHLORIDE 0.9 % IV SOLN: INTRAVENOUS | Status: DC

## 2013-06-09 MED ORDER — LIDOCAINE HCL (CARDIAC) 20 MG/ML IV SOLN
INTRAVENOUS | Status: DC | PRN
Start: 2013-06-09 — End: 2013-06-09
  Administered 2013-06-09: 50 mg via INTRAVENOUS

## 2013-06-09 MED ORDER — LIDOCAINE HCL (CARDIAC) 20 MG/ML IV SOLN
INTRAVENOUS | Status: AC
Start: 2013-06-09 — End: 2013-06-09
  Filled 2013-06-09: qty 5

## 2013-06-09 SURGICAL SUPPLY — 24 items

## 2013-06-09 NOTE — Transfer of Care (Signed)
Immediate Anesthesia Transfer of Care Note  Patient: Annita L Mehta  Procedure(s) Performed: Procedure(s): COLONOSCOPY WITH PROPOFOL (N/A) ESOPHAGOGASTRODUODENOSCOPY (EGD) WITH PROPOFOL (N/A)  Patient Location: PACU  Anesthesia Type:MAC  Level of Consciousness: awake, alert  and oriented  Airway & Oxygen Therapy: Patient Spontanous Breathing and Patient connected to nasal cannula oxygen  Post-op Assessment: Report given to PACU RN and Post -op Vital signs reviewed and stable  Post vital signs: Reviewed and stable  Complications: No apparent anesthesia complications

## 2013-06-09 NOTE — Anesthesia Preprocedure Evaluation (Addendum)
Anesthesia Evaluation  Patient identified by MRN, date of birth, ID band Patient awake    Reviewed: Allergy & Precautions, H&P , NPO status , Patient's Chart, lab work & pertinent test results  Airway Mallampati: II TM Distance: >3 FB Neck ROM: Full    Dental no notable dental hx.    Pulmonary neg pulmonary ROS,  breath sounds clear to auscultation  Pulmonary exam normal       Cardiovascular negative cardio ROS  + dysrhythmias Atrial Fibrillation Rhythm:Regular Rate:Normal     Neuro/Psych negative neurological ROS  negative psych ROS   GI/Hepatic Neg liver ROS, GERD-  Medicated,  Endo/Other  negative endocrine ROS  Renal/GU negative Renal ROS  negative genitourinary   Musculoskeletal negative musculoskeletal ROS (+)   Abdominal   Peds negative pediatric ROS (+)  Hematology negative hematology ROS (+)   Anesthesia Other Findings   Reproductive/Obstetrics negative OB ROS                          Anesthesia Physical Anesthesia Plan  ASA: III  Anesthesia Plan: MAC   Post-op Pain Management:    Induction: Intravenous  Airway Management Planned: Nasal Cannula  Additional Equipment:   Intra-op Plan:   Post-operative Plan:   Informed Consent: I have reviewed the patients History and Physical, chart, labs and discussed the procedure including the risks, benefits and alternatives for the proposed anesthesia with the patient or authorized representative who has indicated his/her understanding and acceptance.   Dental advisory given  Plan Discussed with: CRNA and Surgeon  Anesthesia Plan Comments:         Anesthesia Quick Evaluation

## 2013-06-09 NOTE — Anesthesia Postprocedure Evaluation (Signed)
  Anesthesia Post-op Note  Patient: Catherine [name]  Procedure(s) Performed: Procedure(s) (LRB): COLONOSCOPY WITH PROPOFOL (N/A) ESOPHAGOGASTRODUODENOSCOPY (EGD) WITH PROPOFOL (N/A)  Patient Location: PACU  Anesthesia Type: MAC  Level of Consciousness: awake and alert   Airway and Oxygen Therapy: Patient Spontanous Breathing  Post-op Pain: mild  Post-op Assessment: Post-op Vital signs reviewed, Patient's Cardiovascular Status Stable, Respiratory Function Stable, Patent Airway and No signs of Nausea or vomiting  Last Vitals:  Filed Vitals:   06/09/13 1017  BP: 114/77  Pulse: 76  Temp:   Resp: 16    Post-op Vital Signs: stable   Complications: No apparent anesthesia complications

## 2013-06-09 NOTE — Op Note (Signed)
Problems: Hematochezia, heme positive stool, heartburn  Endoscopist: Earle Gell  Premedication: Propofol administered by anesthesia  Procedure: Diagnostic esophagogastroduodenoscopy The patient was placed in the left lateral decubitus position. The Pentax gastroscope was passed through the posterior hypopharynx into the proximal esophagus without difficulty. The hypopharynx, larynx, and vocal cords appeared normal.  Esophagoscopy: The proximal, mid, and lower segments of the esophageal mucosa appeared normal. The squamocolumnar junction was noted at 40 cm from the incisor teeth. There was no endoscopic evidence for the presence of Barrett's esophagus.  Gastroscopy: Retroflex view of the gastric cardia and fundus was normal. The gastric body, antrum, and pylorus appeared normal.  Duodenoscopy: The duodenal bulb and descending duodenum appeared normal.  Assessment: Normal esophagogastroduodenoscopy  Procedure: Diagnostic colonoscopy Anal inspection and digital rectal exam were normal. The Pentax pediatric colonoscope was introduced into the rectum and advanced to the cecum. A normal-appearing ileocecal valve and appendiceal orifice were identified. Colonic preparation for the exam today was good.  Rectum. Normal. Retroflexed view of the distal rectum normal  Sigmoid colon and descending colon. Normal  Splenic flexure. A 7 mm pedunculated polyp was removed from the splenic flexure with the hot snare and submitted for pathological interpretation  Transverse colon. Normal  Hepatic flexure. Normal  Ascending colon. Normal  Cecum and ileocecal valve. Normal  Assessment: From the splenic flexure a 7 mm pedunculated polyp was removed with the electrocautery snare and submitted for pathologic interpretation; otherwise normal diagnostic proctocolonoscopy to the cecum  Recommendations: If the splenic flexure polyp returns neoplastic pathologically, the patient should undergo a  surveillance colonoscopy in 5 years. If the polyp returns non-neoplastic pathologically, she should undergo a repeat screening colonoscopy in 10 years.

## 2013-06-09 NOTE — H&P (Signed)
  Problems: Hematochezia and heartburn  History: The patient is a 58 year old female born [birth year]. She chronically takes aspirin 325 mg daily to treat antiphospholipid antibody syndrome. She underwent a normal screening colonoscopy on 08/07/2005.  The patient chronically takes proton pump inhibitor therapy to treat chronic heartburn unassociated with dysphagia. She intermittently passes fresh blood with her constipated bowel movements. She submitted stool for Hemoccult testing as part of her routine physical exam. Her stool returned Hemoccult-positive.  The patient is scheduled to undergo a diagnostic esophagogastroduodenoscopy and colonoscopy  Medication allergies: Ciprofloxacin. Codeine. Nonsteroidal anti-inflammatory drugs.  Past medical history: Panic disorder. Hypercholesterolemia. Atrial fibrillation. Anti-phospholipid antibody syndrome. Total abdominal hysterectomy with bilateral salpingo-oophorectomy  Exam: The patient is alert and lying comfortably on the endoscopy stretcher. Abdomen is soft and nontender to palpation. Lungs are clear to auscultation. Cardiac exam reveals a regular rhythm despite his history of atrial fibrillation  Plan: Proceed with diagnostic esophagogastroduodenoscopy and colonoscopy

## 2013-06-09 NOTE — Discharge Instructions (Signed)
Colonoscopy °Care After °These instructions give you information on caring for yourself after your procedure. Your doctor may also give you more specific instructions. Call your doctor if you have any problems or questions after your procedure. °HOME CARE °· Take it easy for the next 24 hours. °· Rest. °· Walk or use warm packs on your belly (abdomen) if you have belly cramping or gas. °· Do not drive for 24 hours. °· You may shower. °· Do not sign important papers or use machinery for 24 hours. °· Drink enough fluids to keep your pee (urine) clear or pale yellow. °· Resume your normal diet. Avoid heavy or fried foods. °· Avoid alcohol. °· Continue taking your normal medicines. °· Only take medicine as told by your doctor. Do not take aspirin. °If you had growths (polyps) removed: °· Do not take aspirin. °· Do not drink alcohol for 7 days or as told by your doctor. °· Eat a soft diet for 24 hours. °GET HELP RIGHT AWAY IF: °· You have a fever. °· You pass clumps of tissue (blood clots) or fill the toilet with blood. °· You have belly pain that gets worse and medicine does not help. °· Your belly is puffy (swollen). °· You feel sick to your stomach (nauseous) or throw up (vomit). °MAKE SURE YOU: °· Understand these instructions. °· Will watch your condition. °· Will get help right away if you are not doing well or get worse. °Document Released: 03/10/2010 Document Revised: 04/30/2011 Document Reviewed: 10/13/2012 °ExitCare® Patient Information ©2014 [employer]. ° °Esophagogastroduodenoscopy °Care After °Refer to this sheet in the next few weeks. These instructions provide you with information on caring for yourself after your procedure. Your caregiver may also give you more specific instructions. Your treatment has been planned according to current medical practices, but problems sometimes occur. Call your caregiver if you have any problems or questions after your procedure.  °HOME CARE INSTRUCTIONS °· Do not eat  or drink anything until the numbing medicine (local anesthetic) has worn off and your gag reflex has returned. You will know that the local anesthetic has worn off when you can swallow comfortably. °· Do not drive for 12 hours after the procedure or as directed by your caregiver. °· Only take medicines as directed by your caregiver. °SEEK MEDICAL CARE IF:  °· You cannot stop coughing. °· You are not urinating at all or less than usual. °SEEK IMMEDIATE MEDICAL CARE IF: °· You have difficulty swallowing. °· You cannot eat or drink. °· You have worsening throat or chest pain. °· You have dizziness, lightheadedness, or you faint. °· You have nausea or vomiting. °· You have chills. °· You have a fever. °· You have severe abdominal pain. °· You have black, tarry, or bloody stools. °Document Released: 01/23/2012 Document Reviewed: 01/23/2012 °ExitCare® Patient Information ©2014 [employer]. ° °Monitored Anesthesia Care  °Monitored anesthesia care is an anesthesia service for a medical procedure. Anesthesia is the loss of the ability to feel pain. It is produced by medications called anesthetics. It may affect a small area of your body (local anesthesia), a large area of your body (regional anesthesia), or your entire body (general anesthesia). The need for monitored anesthesia care depends your procedure, your condition, and the potential need for regional or general anesthesia. It is often provided during procedures where:  °· General anesthesia may be needed if there are complications. This is because you need special care when you are under general anesthesia.   °· You   will be under local or regional anesthesia. This is so that you are able to have higher levels of anesthesia if needed.   °· You will receive calming medications (sedatives). This is especially the case if sedatives are given to put you in a semi-conscious state of relaxation (deep sedation). This is because the amount of sedative needed to produce this  state can be hard to predict. Too much of a sedative can produce general anesthesia. °Monitored anesthesia care is performed by one or more caregivers who have special training in all types of anesthesia. You will need to meet with these caregivers before your procedure. During this meeting, they will ask you about your medical history. They will also give you instructions to follow. (For example, you will need to stop eating and drinking before your procedure. You may also need to stop or change medications you are taking.) During your procedure, your caregivers will stay with you. They will:  °· Watch your condition. This includes watching you blood pressure, breathing, and level of pain.   °· Diagnose and treat problems that occur.   °· Give medications if they are needed. These may include calming medications (sedatives) and anesthetics.   °· Make sure you are comfortable.   °Having monitored anesthesia care does not necessarily mean that you will be under anesthesia. It does mean that your caregivers will be able to manage anesthesia if you need it or if it occurs. It also means that you will be able to have a different type of anesthesia than you are having if you need it. When your procedure is complete, your caregivers will continue to watch your condition. They will make sure any medications wear off before you are allowed to go home.  °Document Released: 11/01/2004 Document Revised: 06/02/2012 Document Reviewed: 03/19/2012 °ExitCare® Patient Information ©2014 [employer]. ° °

## 2013-06-10 ENCOUNTER — Encounter (HOSPITAL_COMMUNITY): Payer: Self-pay | Admitting: Gastroenterology

## 2013-06-22 ENCOUNTER — Encounter: Payer: Self-pay | Admitting: Interventional Cardiology

## 2013-07-16 ENCOUNTER — Other Ambulatory Visit: Payer: Self-pay | Admitting: Interventional Cardiology

## 2013-09-01 ENCOUNTER — Encounter: Payer: Self-pay | Admitting: Interventional Cardiology

## 2013-09-25 ENCOUNTER — Other Ambulatory Visit: Payer: Self-pay | Admitting: Interventional Cardiology

## 2013-10-06 ENCOUNTER — Ambulatory Visit: Payer: BC Managed Care – PPO | Admitting: Interventional Cardiology

## 2013-11-05 ENCOUNTER — Encounter: Payer: Self-pay | Admitting: Interventional Cardiology

## 2013-11-05 ENCOUNTER — Ambulatory Visit (INDEPENDENT_AMBULATORY_CARE_PROVIDER_SITE_OTHER): Payer: BC Managed Care – PPO | Admitting: Interventional Cardiology

## 2013-11-05 VITALS — BP 106/78 | HR 84 | Ht 65.0 in | Wt 177.0 lb

## 2013-11-05 DIAGNOSIS — E782 Mixed hyperlipidemia: Secondary | ICD-10-CM

## 2013-11-05 DIAGNOSIS — I4891 Unspecified atrial fibrillation: Secondary | ICD-10-CM

## 2013-11-05 DIAGNOSIS — I480 Paroxysmal atrial fibrillation: Secondary | ICD-10-CM

## 2013-11-05 NOTE — Patient Instructions (Signed)
Your physician recommends that you continue on your current medications as directed. Please refer to the Current Medication list given to you today.  Your physician wants you to follow-up in: 1 year with Dr. Varanasi. You will receive a reminder letter in the mail two months in advance. If you don't receive a letter, please call our office to schedule the follow-up appointment.  

## 2013-11-05 NOTE — Progress Notes (Signed)
Patient ID: Catherine [name], female   DOB: March 23, 1955, 58 y.o.   MRN: [id number] Patient ID: Catherine [name], female   DOB: [birth year], 58 y.o.   MRN: [id number]    [place], [address] [place], [place]  [address] Phone: [phone] Fax:  [phone]  Date:  11/05/2013   ID:  Catherine [name], DOB [birth year], MRN [id number]  PCP:  Serita Grammes, CNM      History of Present Illness: Catherine [name] is a 58 y.o. female  who has PAF. SHe has had minimal AFib until 11/30/12. No exertional chest pain. SHe does not do much exercise. She only walks her dog. Aspirin tolerated well. Still has caffeine. Increased tea/coffee consumption, which can cause some palpitations. Atrial Fibrillation F/U:  c/o Palpitations rare.  Denies : Chest pain.  Dizziness.  Leg edema.  Orthopnea.  Shortness of breath.  Syncope.    Since 11/30/12, while going to [place], she has had more palpitations- episodes lasting hours at a time.  Her metoprolol was increased and sx are better but not gone completely.  She has out out caffeine and sx have not resolved.  It is an intermittent pulse.  Not as severe as the past AFib.  No syncope.   Sx more present in the evening. She has had some CP ,  Sharp pains lasting seconds.  None with exertion.  Stress test in 2002 negative. Walks regularly wihtout problems.    Since last visit, she has less palpitaitons.  SHe can take an extra metoprolol when needed, which relieves sx.  1 cup coffee/day.  Some tea, decaf.   Wt Readings from Last 3 Encounters:  11/05/13 177 lb (80.287 kg)  06/09/13 178 lb (80.74 kg)  06/09/13 178 lb (80.74 kg)     Past Medical History  Diagnosis Date  . Panic disorder   . Anti-phospholipid syndrome   . Atrial fibrillation     on asa 325  . PAF (paroxysmal atrial fibrillation)   . Mixed hyperlipidemia   . GERD (gastroesophageal reflux disease)   . Generalized osteoarthrosis, unspecified site   . Major depression   . Complication of anesthesia    . PONV (postoperative nausea and vomiting)   . Dysrhythmia     hx. PAF(paroxysmal Atrial Fib)    Current Outpatient Prescriptions  Medication Sig Dispense Refill  . aspirin 325 MG tablet Take 325 mg by mouth at bedtime.       Marland Kitchen buPROPion (WELLBUTRIN XL) 150 MG 24 hr tablet Take 150 mg by mouth every morning. Take one tablet daily      . CALCIUM-MAGNESIUM PO Take 1 tablet by mouth daily.      . cholecalciferol (VITAMIN D) 1000 UNITS tablet Take 1,000 Units by mouth daily.      Marland Kitchen esomeprazole (NEXIUM) 40 MG capsule Take 40 mg by mouth daily at 12 noon.      . fish oil-omega-3 fatty acids 1000 MG capsule Take 1 g by mouth daily.       . metoprolol succinate (TOPROL-XL) 25 MG 24 hr tablet TAKE 1 TABLET DAILY  90 tablet  0  . simvastatin (ZOCOR) 5 MG tablet Take 5 mg by mouth at bedtime.      Marland Kitchen venlafaxine XR (EFFEXOR-XR) 75 MG 24 hr capsule Take 150 mg by mouth every morning.        No current facility-administered medications for this visit.    Allergies:    Allergies  Allergen Reactions  .  Codeine Nausea And Vomiting  . Dairy Aid [Lactase]     Burns throat and makes pt. Head feel stuffy with headache  . Nsaids     Has blood clot syndrome  . Other     Patient is allergic to nuts, it gives her headache   . Ciprofloxacin Hcl Swelling and Rash    Social History:  The patient  reports that she has never smoked. She does not have any smokeless tobacco history on file. She reports that she does not drink alcohol or use illicit drugs.   Family History:  The patient's family history includes Cancer in her father, maternal grandfather, and maternal grandmother; Diabetes in her maternal grandmother; Hyperlipidemia in her brother and brother; Hypertension in her brother and brother.   ROS:  Please see the history of present illness.  No nausea, vomiting.  No fevers, chills.  No focal weakness.  No dysuria. palpitations   All other systems reviewed and negative.   PHYSICAL EXAM: VS:  BP  106/78  Pulse 84  Ht 5\' 5"  (1.651 m)  Wt 177 lb (80.287 kg)  BMI 29.45 kg/m2 Well nourished, well developed, in no acute distress HEENT: normal Neck: no JVD, no carotid bruits Cardiac:  normal S1, S2; RRR;  Lungs:  clear to auscultation bilaterally, no wheezing, rhonchi or rales Abd: soft, nontender, no hepatomegaly Ext: no edema Skin: warm and dry Neuro:   no focal abnormalities noted  EKG:  NSR. No ST segment  ASSESSMENT AND PLAN:  1. Atrial fibrillation  Continue Metoprolol Succinate Tablet Extended Release 24 Hour, 50 MG, 1 tablet, Orally, Once a day, 90, Refills 3 Continue Aspirin Tablet, 325 mg, 1 tablet, Orally, Once a day Diagnostic Imaging:EKG Harward,Amy 10/09/2011 03:44:41 PM > Hibba Schram,JAY 10/09/2011 04:31:58 PM > NSR, nonspecific ST changes  currently in sinus rhythm. Worsening palpitations. Plan or lifewatch monitor.    2. Mixed hyperlipidemia  Continue Simvastatin Tablet, 5 MG, 1 tablet every evening, Orally, Once a day Lipids well controlled. LDL 83 in 2/15.   3. Chest pain: Resolved.  No ischemia on prior stress in 2002.   Preventive Medicine  Adult topics discussed:  Exercise: 5 days a week, at least 30 minutes of aerobic exercise.      Signed, Mina Marble, MD, [facility] 11/05/2013 2:55 PM and and and had date with his symptoms have any evidence of more H. poor in her

## 2013-12-05 ENCOUNTER — Other Ambulatory Visit: Payer: Self-pay | Admitting: Interventional Cardiology

## 2014-04-25 ENCOUNTER — Other Ambulatory Visit: Payer: Self-pay | Admitting: Interventional Cardiology

## 2014-06-03 ENCOUNTER — Other Ambulatory Visit: Payer: Self-pay | Admitting: Family Medicine

## 2014-06-03 DIAGNOSIS — Z1231 Encounter for screening mammogram for malignant neoplasm of breast: Secondary | ICD-10-CM

## 2014-07-05 ENCOUNTER — Ambulatory Visit
Admission: RE | Admit: 2014-07-05 | Discharge: 2014-07-05 | Disposition: A | Discharge status: Home / Self Care | Payer: BLUE CROSS/BLUE SHIELD | Source: Ambulatory Visit | Attending: Family Medicine | Admitting: Family Medicine

## 2014-07-05 ENCOUNTER — Other Ambulatory Visit: Payer: Self-pay | Admitting: Family Medicine

## 2014-07-05 DIAGNOSIS — Z1231 Encounter for screening mammogram for malignant neoplasm of breast: Secondary | ICD-10-CM

## 2014-10-22 ENCOUNTER — Other Ambulatory Visit: Payer: Self-pay | Admitting: Cardiology

## 2015-01-22 ENCOUNTER — Other Ambulatory Visit: Payer: Self-pay | Admitting: Cardiology

## 2015-01-24 NOTE — Telephone Encounter (Signed)
REFILL 

## 2015-04-23 ENCOUNTER — Other Ambulatory Visit: Payer: Self-pay | Admitting: Cardiology

## 2015-04-25 NOTE — Telephone Encounter (Signed)
Rx refill sent to pharmacy. 

## 2015-07-12 ENCOUNTER — Encounter: Payer: Self-pay | Admitting: Interventional Cardiology

## 2015-10-06 ENCOUNTER — Other Ambulatory Visit: Payer: Self-pay | Admitting: Interventional Cardiology

## 2015-10-06 NOTE — Telephone Encounter (Signed)
metoprolol succinate (TOPROL-XL) 25 MG 24 hr tablet  Medication  Date: 04/25/2015 Department: [place] Ordering/Authorizing: Jettie Booze, MD  Order Providers   Prescribing Provider Encounter Provider  Jettie Booze, MD Minus Breeding, MD  Medication Detail    Disp Refills Start End   metoprolol succinate (TOPROL-XL) 25 MG 24 hr tablet 90 tablet 0 04/25/2015    Sig: TAKE 1 TABLET DAILY   Notes to Pharmacy: Please schedule appointment for refills.   E-Prescribing Status: Receipt confirmed by pharmacy (04/25/2015 5:15 PM EST)   Pharmacy   [place], [place]

## 2015-10-10 ENCOUNTER — Other Ambulatory Visit: Payer: Self-pay | Admitting: Family Medicine

## 2015-10-10 DIAGNOSIS — Z1231 Encounter for screening mammogram for malignant neoplasm of breast: Secondary | ICD-10-CM

## 2015-10-12 ENCOUNTER — Telehealth: Payer: Self-pay | Admitting: Interventional Cardiology

## 2015-10-12 NOTE — Telephone Encounter (Signed)
New message      *STAT* If patient is at the pharmacy, call can be transferred to refill team.   1. Which medications need to be refilled? (please list name of each medication and dose if known) metoprolol 25mg   2. Which pharmacy/location (including street and city if local pharmacy) is medication to be sent to? [facility]  3. Do they need a 30 day or 90 day supply? [place]

## 2015-10-12 NOTE — Telephone Encounter (Signed)
**Note De-Identified Catherine [name] Obfuscation** Allow 90 day supply with 0 refills since the pt has not been seen for 2 years. Thanks.

## 2015-10-12 NOTE — Telephone Encounter (Signed)
Patient has not been seen since 2015 but has an appointment scheduled for 11/16/15. Ok to refill for #90 or should it only be authorized for #30 until she comes in?

## 2015-10-13 ENCOUNTER — Other Ambulatory Visit: Payer: Self-pay | Admitting: *Deleted

## 2015-10-13 MED ORDER — METOPROLOL SUCCINATE ER 25 MG PO TB24: 25.0000 mg | ORAL_TABLET | Freq: Every day | ORAL | 0 refills | Status: DC

## 2015-10-17 ENCOUNTER — Ambulatory Visit
Admission: RE | Admit: 2015-10-17 | Discharge: 2015-10-17 | Disposition: A | Discharge status: Home / Self Care | Payer: BLUE CROSS/BLUE SHIELD | Source: Ambulatory Visit | Attending: Family Medicine | Admitting: Family Medicine

## 2015-10-17 DIAGNOSIS — Z1231 Encounter for screening mammogram for malignant neoplasm of breast: Secondary | ICD-10-CM

## 2015-11-02 ENCOUNTER — Encounter: Payer: Self-pay | Admitting: Interventional Cardiology

## 2015-11-16 ENCOUNTER — Encounter: Payer: Self-pay | Admitting: Interventional Cardiology

## 2015-11-16 ENCOUNTER — Ambulatory Visit (INDEPENDENT_AMBULATORY_CARE_PROVIDER_SITE_OTHER): Payer: BLUE CROSS/BLUE SHIELD | Admitting: Interventional Cardiology

## 2015-11-16 VITALS — BP 130/90 | HR 76 | Ht 65.0 in | Wt 180.0 lb

## 2015-11-16 DIAGNOSIS — E785 Hyperlipidemia, unspecified: Secondary | ICD-10-CM

## 2015-11-16 DIAGNOSIS — I480 Paroxysmal atrial fibrillation: Secondary | ICD-10-CM | POA: Diagnosis not present

## 2015-11-16 DIAGNOSIS — Z23 Encounter for immunization: Secondary | ICD-10-CM

## 2015-11-16 MED ORDER — METOPROLOL SUCCINATE ER 25 MG PO TB24: 25.0000 mg | ORAL_TABLET | Freq: Every day | ORAL | 3 refills | Status: AC

## 2015-11-16 NOTE — Patient Instructions (Signed)
**Note De-identified Catherine [name] Obfuscation** Medication Instructions:  Same-no changes  Labwork: None  Testing/Procedures: None  Follow-Up: Your physician wants you to follow-up in: 1 year. You will receive a reminder letter in the mail two months in advance. If you don't receive a letter, please call our office to schedule the follow-up appointment.      If you need a refill on your cardiac medications before your next appointment, please call your pharmacy.   

## 2015-11-16 NOTE — Progress Notes (Signed)
Cardiology Office Note   Date:  11/16/2015   ID:  BRANTLEY DOCKTER, DOB 02/17/1956, MRN [id number]  PCP:  Serita Grammes, CNM    No chief complaint on file. PAF   Wt Readings from Last 3 Encounters:  11/16/15 180 lb (81.6 kg)  11/05/13 177 lb (80.3 kg)  06/09/13 178 lb (80.7 kg)       History of Present Illness: Catherine [name] is a 60 y.o. female  who has PAF. SHe has had minimal AFib until an episode in 11/30/12. No exertional chest pain. SHe does not do much exercise. She only walks her dog. Aspirin tolerated well. Still has caffeine. Increased tea/coffee consumption, which can cause some palpitations.  In 2014,  while going to [place], she has had more palpitations- episodes lasting hours at a time.  Her metoprolol was increased and sx were better but not gone completely.  She has decreased caffeine in the past and sx have not resolved.  It is an intermittent pulse.  Not as severe as the past AFib.  No syncope.   Sx more present in the evening.   She has had some CP ,  Sharp pains lasting seconds.  With stress , she has had tightness.  THere is some job stress at this time. None with exertion.  Stress test in 2002 negative. Walks regularly without problems. Walks her dogs.    Since last visit, she has less palpitaitons.  SHe can take an extra metoprolol when needed, which relieves sx.  1 cup coffee/day.  Some tea, decaf.  No bleeding problems or joint pains.   SHe does not check her BP outside of the MDs office.    She has not needed extra metoprolol.  Daughter has moved to [place] for job reasons.  THis is a source of stress.    Past Medical History:  Diagnosis Date  . Anti-phospholipid syndrome ([place])   . Atrial fibrillation ([place])    on asa 325  . Complication of anesthesia   . Dysrhythmia    hx. PAF(paroxysmal Atrial Fib)  . Generalized osteoarthrosis, unspecified site   . GERD (gastroesophageal reflux disease)   . Major depression ([place])   . Mixed  hyperlipidemia   . PAF (paroxysmal atrial fibrillation) ([place])   . Panic disorder   . PONV (postoperative nausea and vomiting)     Past Surgical History:  Procedure Laterality Date  . COLONOSCOPY WITH PROPOFOL N/A 06/09/2013   Procedure: COLONOSCOPY WITH PROPOFOL;  Surgeon: Garlan Fair, MD;  Location: [facility];  Service: Endoscopy;  Laterality: N/A;  . DIAGNOSTIC LAPAROSCOPY     fibroid removal  . ESOPHAGOGASTRODUODENOSCOPY (EGD) WITH PROPOFOL N/A 06/09/2013   Procedure: ESOPHAGOGASTRODUODENOSCOPY (EGD) WITH PROPOFOL;  Surgeon: Garlan Fair, MD;  Location: [facility];  Service: Endoscopy;  Laterality: N/A;  . TONSILLECTOMY    . TOTAL ABDOMINAL HYSTERECTOMY W/ BILATERAL SALPINGOOPHORECTOMY     fibroids     Current Outpatient Prescriptions  Medication Sig Dispense Refill  . aspirin 325 MG tablet Take 325 mg by mouth at bedtime.     Marland Kitchen buPROPion (WELLBUTRIN SR) 150 MG 12 hr tablet Take 300 mg by mouth daily.    . Calcium Carbonate-Vitamin D (CALCIUM-VITAMIN D) 500-200 MG-UNIT tablet Take 1 tablet by mouth daily.    Marland Kitchen CALCIUM-MAGNESIUM PO Take 1 tablet by mouth daily.    Marland Kitchen CORAL CALCIUM-MAGNESIUM-VIT D PO Take 1 tablet by mouth daily.    Marland Kitchen escitalopram (LEXAPRO) 20 MG tablet Take 20  mg by mouth daily.     . fish oil-omega-3 fatty acids 1000 MG capsule Take 1 g by mouth daily.     . metoprolol succinate (TOPROL-XL) 25 MG 24 hr tablet Take 1 tablet (25 mg total) by mouth daily. MUST ATTEND APPOINTMENT 90 tablet 0  . omeprazole (PRILOSEC) 10 MG capsule Take 10 mg by mouth daily.    . simvastatin (ZOCOR) 5 MG tablet Take 5 mg by mouth at bedtime.     No current facility-administered medications for this visit.     Allergies:   Codeine; Dairy aid [lactase]; Nsaids; Other; and Ciprofloxacin hcl    Social History:  The patient  reports that she has never smoked. She has never used smokeless tobacco. She reports that she does not drink alcohol or use drugs.   Family History:   The patient's family history includes Cancer in her father, maternal grandfather, and maternal grandmother; Diabetes in her maternal grandmother; Hyperlipidemia in her brother and brother; Hypertension in her brother and brother.    ROS:  Please see the history of present illness.   Otherwise, review of systems are positive for job stress from her boss.   All other systems are reviewed and negative.    PHYSICAL EXAM: VS:  BP 130/90   Pulse 76   Ht 5\' 5"  (1.651 m)   Wt 180 lb (81.6 kg)   BMI 29.95 kg/m  , BMI Body mass index is 29.95 kg/m. GEN: Well nourished, well developed, in no acute distress  HEENT: normal  Neck: no JVD, carotid bruits, or masses Cardiac: RRR; no murmurs, rubs, or gallops,no edema  Respiratory:  clear to auscultation bilaterally, normal work of breathing GI: soft, nontender, nondistended, + BS MS: no deformity or atrophy  Skin: warm and dry, no rash Neuro:  Strength and sensation are intact Psych: euthymic mood, full affect   EKG:   The ekg ordered today demonstrates NSR, first degree AV block   Recent Labs: No results found for requested labs within last 8760 hours.   Lipid Panel No results found for: CHOL, TRIG, HDL, CHOLHDL, VLDL, LDLCALC, LDLDIRECT   Other studies Reviewed: Additional studies/ records that were reviewed today with results demonstrating: Monitor 2014: NSR, PACs , PVCs.   ASSESSMENT AND PLAN:  1. Atrial fibrillation: Continue metoprolol and aspirin.  No bleeding issues.  Sx well controlled. Can use extra half tab of metoprolol for palpitations if needed. 2. Mixed hyperlipidemia: Continue simvastatin.  Checked with Dr. Brigitte Pulse. 3. Try to increase exercise.  THis will help with her stress.  4. She declined the flu shot saying she never gets one.   Current medicines are reviewed at length with the patient today.  The patient concerns regarding her medicines were addressed.  The following changes have been made:  No change  Labs/  tests ordered today include:     Recommend 150 minutes/week of aerobic exercise Low fat, low carb, high fiber diet recommended  Disposition:   FU in 1 year   Signed, Larae Grooms, MD  11/16/2015 10:19 AM    [facility] [place], [place], [place]  [address] Phone: [phone]; Fax: [phone]

## 2016-02-16 IMAGING — MG MM SCREEN MAMMOGRAM BILATERAL
4 series · 4 of 4 positions shown · non-contrast
Comparison: Previous exam(s).

CLINICAL DATA: Screening.

EXAM:
DIGITAL SCREENING BILATERAL MAMMOGRAM WITH CAD

[R CC]
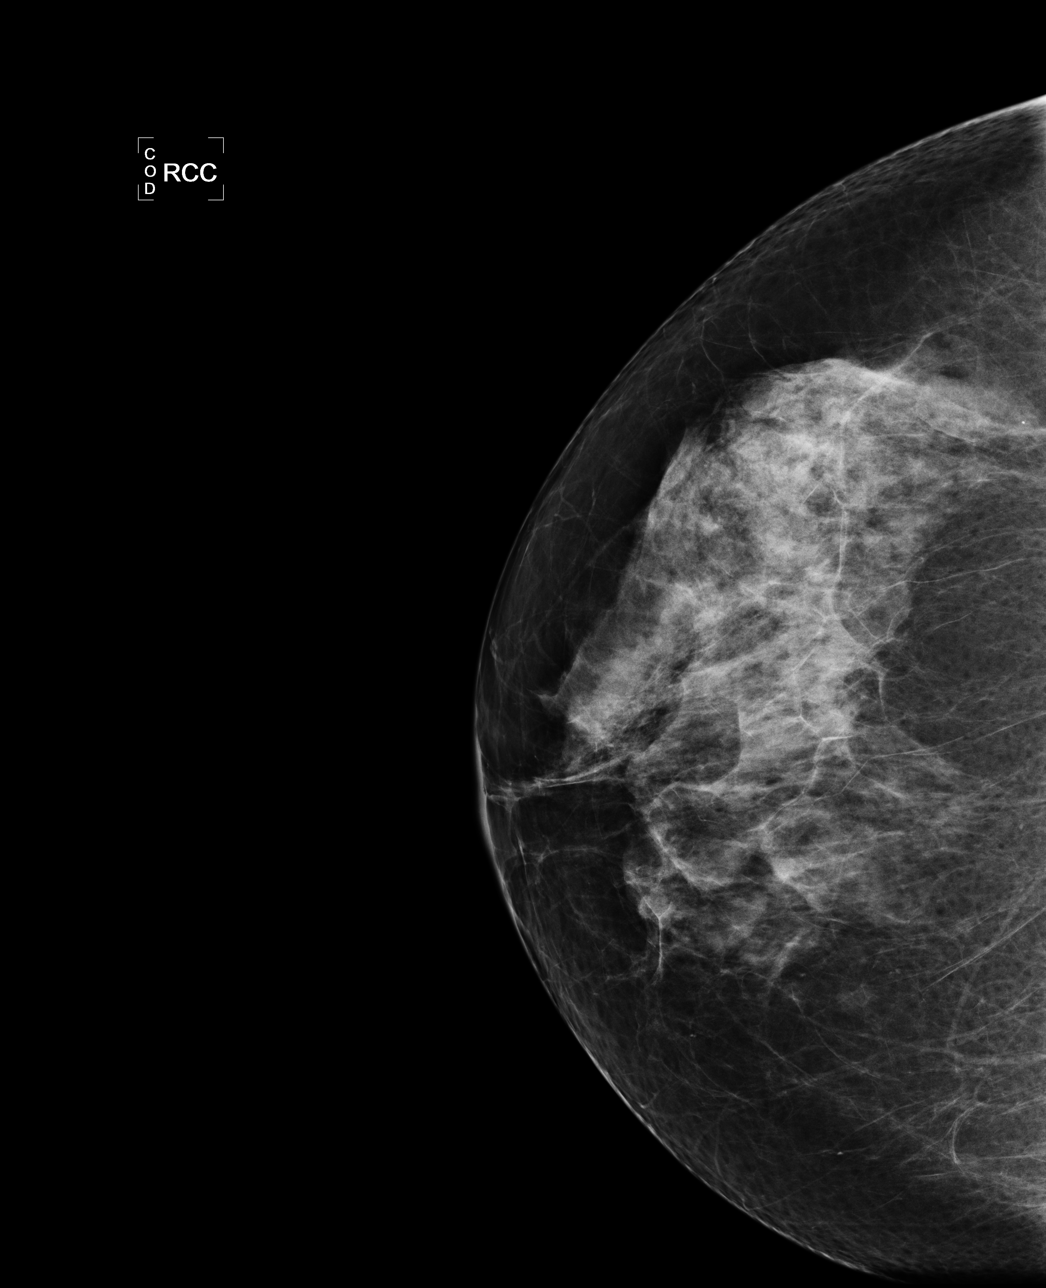

[L CC]
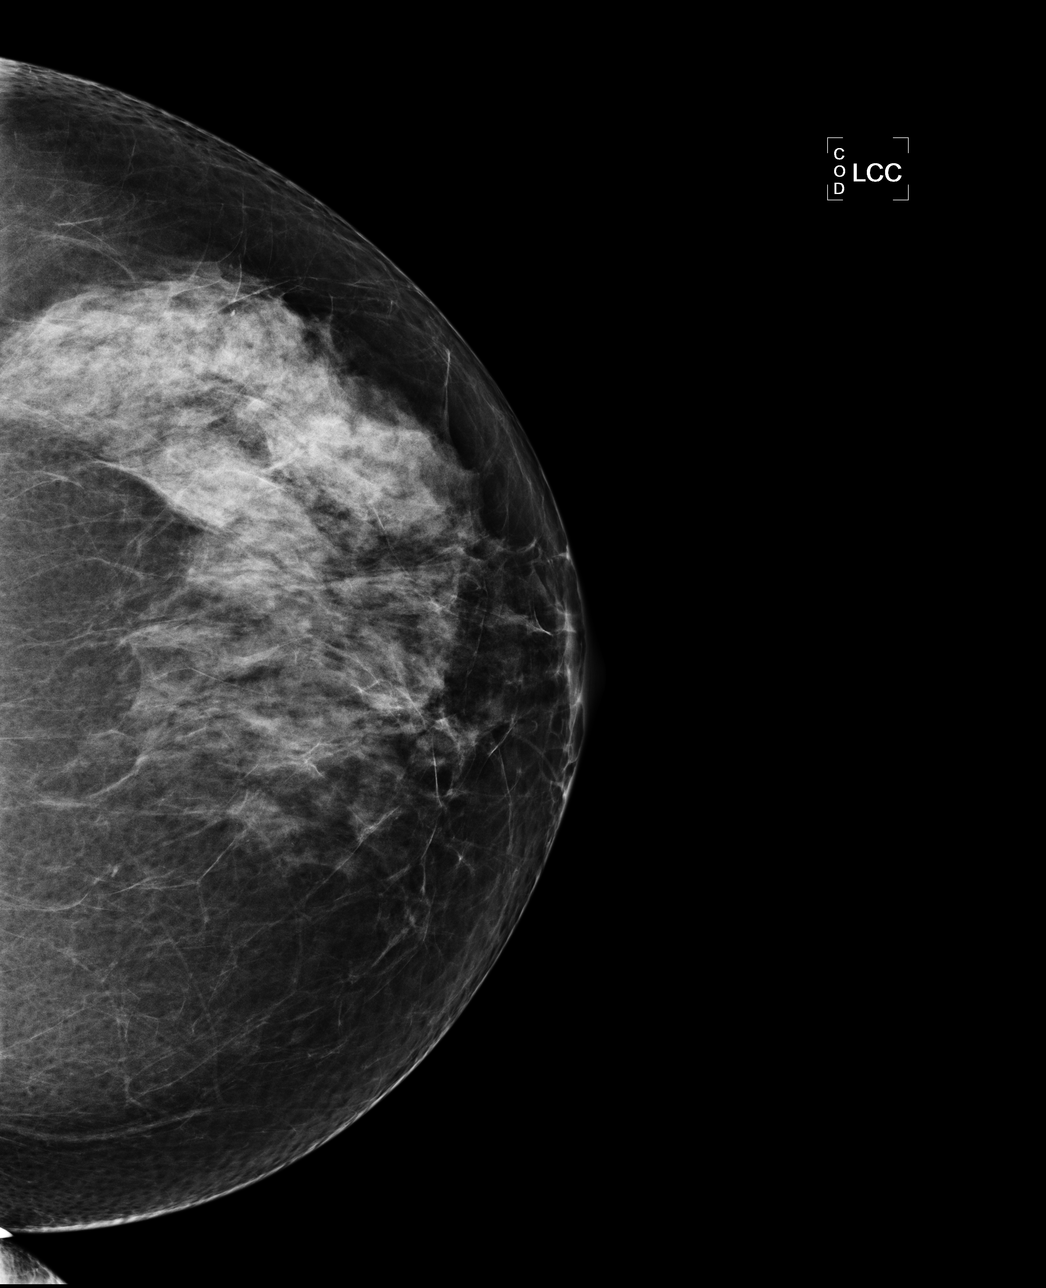

[L MLO]
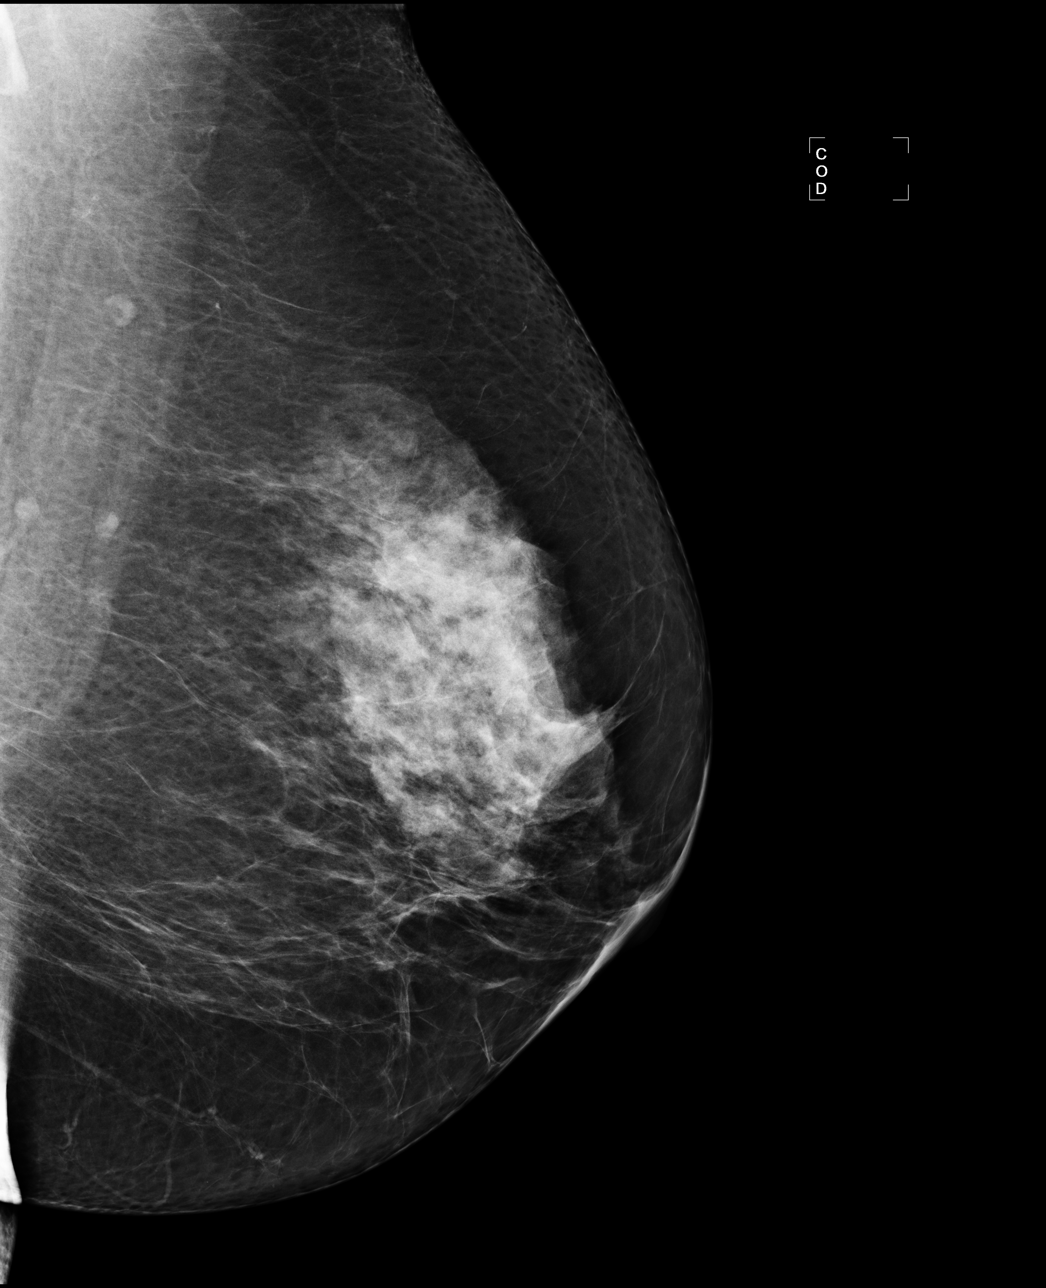

[R MLO]
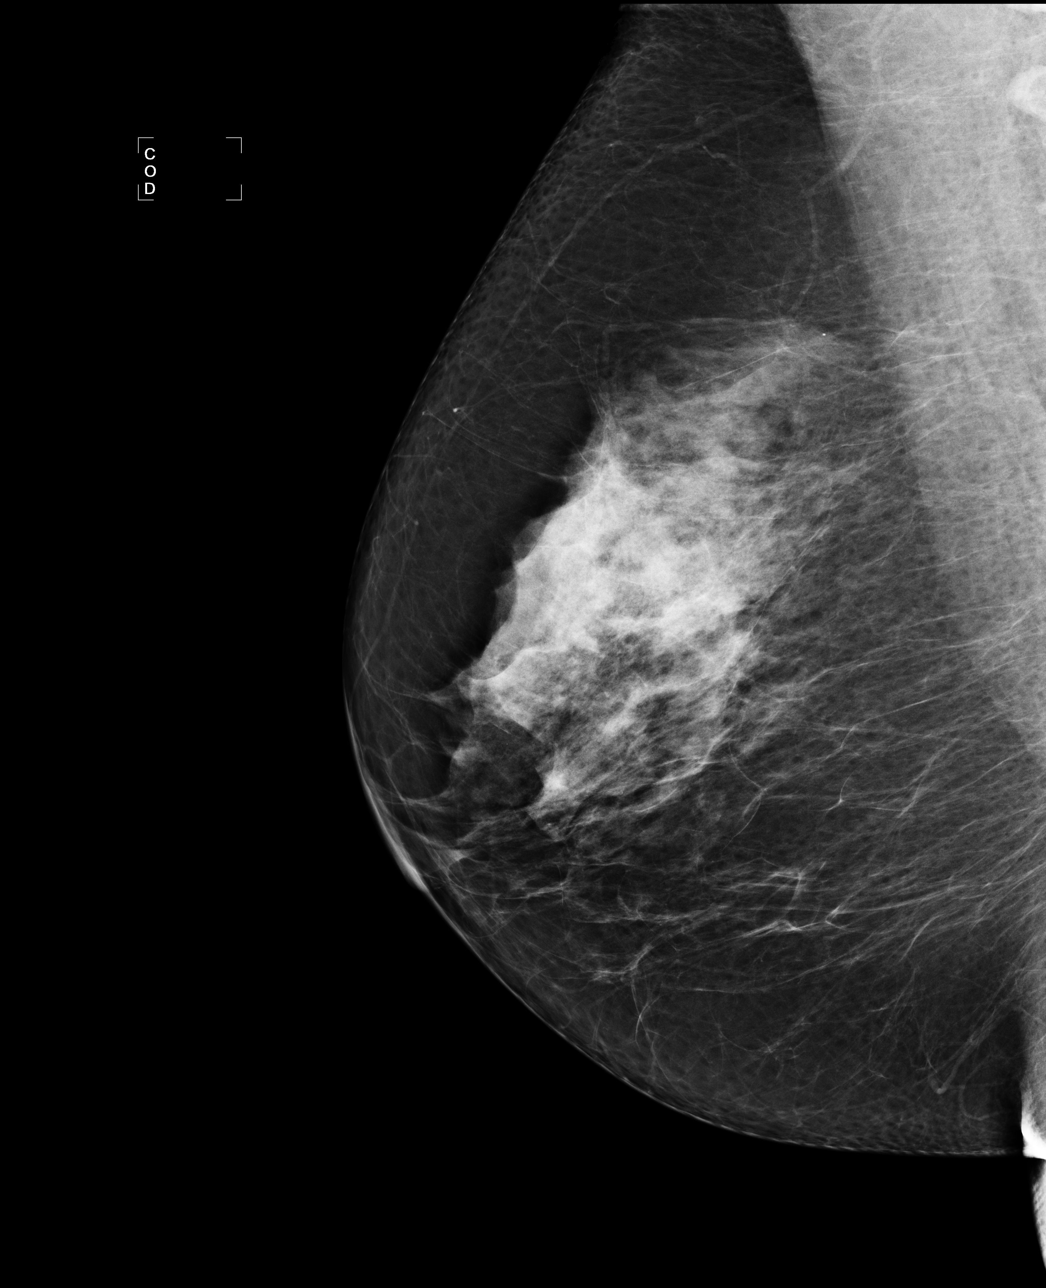

[4 of 4 positions shown; findings below may reference images not displayed]

ACR Breast Density Category d: The breast tissue is extremely dense,
which lowers the sensitivity of mammography.
FINDINGS: There are no findings suspicious for malignancy. Images were
processed with CAD.
IMPRESSION: No mammographic evidence of malignancy. A result letter of this
screening mammogram will be mailed directly to the patient.

RECOMMENDATION:
Screening mammogram in one year. (Code:BD-D-K0F)

BI-RADS CATEGORY  1: Negative.

## 2016-11-14 NOTE — Progress Notes (Signed)
Cardiology Office Note   Date:  11/18/2016   ID:  Azelea, Seguin [birth year], MRN [id number]  PCP:  Myrtis Ser, CNM    No chief complaint on file. PAF   Wt Readings from Last 3 Encounters:  11/15/16 171 lb 12.8 oz (77.9 kg)  11/16/15 180 lb (81.6 kg)  11/05/13 177 lb (80.3 kg)       History of Present Illness: Catherine [name] is a 61 y.o. female  who has PAF. SHe has had minimal AFib until an episode in 11/30/12.  In 2014,  while going to [place], she has had more palpitations- episodes lasting hours at a time. Her metoprolol was increased and sx were better.  Monitor in 12/14 showed NSR with PACs, PVCs.  She has done well since her last visit.  She felt more palpitations when she tried to reduce buproprion.  She went back to the original dose.  She continues to minimize caffeine with the exception of a cup of coffee.  She changed jobs and now works at [employer].  She organizes cards in various dept stores.  She has lost 12 lbs from the increased activity.  She is much happier in her new job. The old job carried with a lot of stress.  Rare nosebleed.  Occasional nuisance bleeding from the skin.  Denies : Chest pain. Dizziness. Leg edema. Nitroglycerin use. Orthopnea.  Paroxysmal nocturnal dyspnea. Shortness of breath. Syncope.    Past Medical History:  Diagnosis Date  . Anti-phospholipid syndrome ([place])   . Atrial fibrillation ([place])    on asa 325  . Complication of anesthesia   . Dysrhythmia    hx. PAF(paroxysmal Atrial Fib)  . Generalized osteoarthrosis, unspecified site   . GERD (gastroesophageal reflux disease)   . Major depression   . Mixed hyperlipidemia   . PAF (paroxysmal atrial fibrillation) ([place])   . Panic disorder   . PONV (postoperative nausea and vomiting)     Past Surgical History:  Procedure Laterality Date  . COLONOSCOPY WITH PROPOFOL N/A 06/09/2013   Procedure: COLONOSCOPY WITH PROPOFOL;  Surgeon: Garlan Fair, MD;  [facility];  Service: Endoscopy;  Laterality: N/A;  . DIAGNOSTIC LAPAROSCOPY     fibroid removal  . ESOPHAGOGASTRODUODENOSCOPY (EGD) WITH PROPOFOL N/A 06/09/2013   Procedure: ESOPHAGOGASTRODUODENOSCOPY (EGD) WITH PROPOFOL;  Surgeon: Garlan Fair, MD;  [facility];  Service: Endoscopy;  Laterality: N/A;  . TONSILLECTOMY    . TOTAL ABDOMINAL HYSTERECTOMY W/ BILATERAL SALPINGOOPHORECTOMY     fibroids     Current Outpatient Prescriptions  Medication Sig Dispense Refill  . buPROPion (WELLBUTRIN SR) 150 MG 12 hr tablet Take 300 mg by mouth daily.    . Calcium Carbonate-Vitamin D (CALCIUM-VITAMIN D) 500-200 MG-UNIT tablet Take 1 tablet by mouth daily.    Marland Kitchen CALCIUM-MAGNESIUM PO Take 1,000 mg by mouth daily.     Marland Kitchen CORAL CALCIUM-MAGNESIUM-VIT D PO Take 1 tablet by mouth daily.    Marland Kitchen escitalopram (LEXAPRO) 20 MG tablet Take 20 mg by mouth daily.     . fish oil-omega-3 fatty acids 1000 MG capsule Take 1 g by mouth daily.     . metoprolol succinate (TOPROL-XL) 25 MG 24 hr tablet Take 1 tablet (25 mg total) by mouth daily. MUST ATTEND APPOINTMENT 90 tablet 3  . omeprazole (PRILOSEC) 10 MG capsule Take 10 mg by mouth daily.    . simvastatin (ZOCOR) 5 MG tablet Take 5 mg by mouth at bedtime.    Marland Kitchen  aspirin EC 81 MG tablet Take 1 tablet (81 mg total) by mouth daily. 30 tablet 11   No current facility-administered medications for this visit.     Allergies:   Codeine; Dairy aid [lactase]; Nsaids; Other; and Ciprofloxacin hcl    Social History:  The patient  reports that she has never smoked. She has never used smokeless tobacco. She reports that she does not drink alcohol or use drugs.   Family History:  The patient's family history includes Cancer in her father, maternal grandfather, and maternal grandmother; Diabetes in her maternal grandmother; Hyperlipidemia in her brother and brother; Hypertension in her brother and brother.    ROS:  Please see the history of present illness.    Otherwise, review of systems are positive for palpitations as above.   All other systems are reviewed and negative.    PHYSICAL EXAM: VS:  BP 100/70   Pulse 77   Ht 5\' 5"  (1.651 m)   Wt 171 lb 12.8 oz (77.9 kg)   SpO2 98%   BMI 28.59 kg/m  , BMI Body mass index is 28.59 kg/m. GEN: Well nourished, well developed, in no acute distress  HEENT: normal  Neck: no JVD, carotid bruits, or masses Cardiac: RRR; no murmurs, rubs, or gallops,no edema  Respiratory:  clear to auscultation bilaterally, normal work of breathing GI: soft, nontender, nondistended, + BS MS: no deformity or atrophy  Skin: warm and dry, no rash Neuro:  Strength and sensation are intact Psych: euthymic mood, full affect   EKG:   The ekg ordered today demonstrates NSR, NSST   Recent Labs: No results found for requested labs within last 8760 hours.   Lipid Panel No results found for: CHOL, TRIG, HDL, CHOLHDL, VLDL, LDLCALC, LDLDIRECT   Other studies Reviewed: Additional studies/ records that were reviewed today with results demonstrating: December 2014 monitor showing normal sinus rhythm with occasional PACs and PVCs.   ASSESSMENT AND PLAN:  1. AFib:COntinue aspirin and metoprolol.  Can take an extra half tab of metoporolol for symptoms. She has not had to do this.  She will change aspirin to 81 mg daily. 2. PVC, PAC: noted on 2014 monitor.  3. Hyperlipidemia: Checked with PMD. Continue simvastatin   Current medicines are reviewed at length with the patient today.  The patient concerns regarding her medicines were addressed.  The following changes have been made:  No change  Labs/ tests ordered today include:   Orders Placed This Encounter  Procedures  . EKG 12-Lead    Recommend 150 minutes/week of aerobic exercise Low fat, low carb, high fiber diet recommended  Disposition:   FU in 1 year   Signed, Larae Grooms, MD  11/18/2016 3:11 PM    [facility] [place], [place], [place]  [address] Phone: [phone]; Fax: [phone]

## 2016-11-15 ENCOUNTER — Encounter (INDEPENDENT_AMBULATORY_CARE_PROVIDER_SITE_OTHER): Payer: Self-pay

## 2016-11-15 ENCOUNTER — Encounter: Payer: Self-pay | Admitting: Interventional Cardiology

## 2016-11-15 ENCOUNTER — Ambulatory Visit (INDEPENDENT_AMBULATORY_CARE_PROVIDER_SITE_OTHER): Payer: BLUE CROSS/BLUE SHIELD | Admitting: Interventional Cardiology

## 2016-11-15 VITALS — BP 100/70 | HR 77 | Ht 65.0 in | Wt 171.8 lb

## 2016-11-15 DIAGNOSIS — I491 Atrial premature depolarization: Secondary | ICD-10-CM

## 2016-11-15 DIAGNOSIS — I480 Paroxysmal atrial fibrillation: Secondary | ICD-10-CM

## 2016-11-15 DIAGNOSIS — E782 Mixed hyperlipidemia: Secondary | ICD-10-CM | POA: Diagnosis not present

## 2016-11-15 DIAGNOSIS — I493 Ventricular premature depolarization: Secondary | ICD-10-CM | POA: Diagnosis not present

## 2016-11-15 MED ORDER — ASPIRIN EC 81 MG PO TBEC: 81.0000 mg | DELAYED_RELEASE_TABLET | Freq: Every day | ORAL | 11 refills | Status: AC

## 2016-11-15 NOTE — Patient Instructions (Signed)
Medication Instructions:  Your physician has recommended you make the following change in your medication:   DECREASE Aspirin to 81 mg daily.   You may break your adult aspirin in 1/2 until you complete your supply and then switch to a baby aspirin  Labwork: None ordered  Testing/Procedures: None ordered  Follow-Up: Your physician wants you to follow-up in: 1 year with Dr. Irish Lack. You will receive a reminder letter in the mail two months in advance. If you don't receive a letter, please call our office to schedule the follow-up appointment.   Any Other Special Instructions Will Be Listed Below (If Applicable).     If you need a refill on your cardiac medications before your next appointment, please call your pharmacy.

## 2017-03-20 ENCOUNTER — Other Ambulatory Visit: Payer: Self-pay | Admitting: Family Medicine

## 2017-03-20 DIAGNOSIS — Z1231 Encounter for screening mammogram for malignant neoplasm of breast: Secondary | ICD-10-CM

## 2017-04-16 ENCOUNTER — Ambulatory Visit: Payer: BLUE CROSS/BLUE SHIELD

## 2017-07-31 ENCOUNTER — Ambulatory Visit
Admission: RE | Admit: 2017-07-31 | Discharge: 2017-07-31 | Disposition: A | Discharge status: Home / Self Care | Payer: BLUE CROSS/BLUE SHIELD | Source: Ambulatory Visit | Attending: Family Medicine | Admitting: Family Medicine

## 2017-07-31 DIAGNOSIS — Z1231 Encounter for screening mammogram for malignant neoplasm of breast: Secondary | ICD-10-CM

## 2017-10-22 ENCOUNTER — Ambulatory Visit: Payer: BLUE CROSS/BLUE SHIELD | Admitting: Physician Assistant

## 2017-11-19 ENCOUNTER — Ambulatory Visit: Payer: BLUE CROSS/BLUE SHIELD | Admitting: Cardiology

## 2017-11-19 ENCOUNTER — Encounter: Payer: Self-pay | Admitting: Cardiology

## 2017-11-19 VITALS — BP 120/80 | HR 73 | Ht 65.0 in | Wt 173.4 lb

## 2017-11-19 DIAGNOSIS — I480 Paroxysmal atrial fibrillation: Secondary | ICD-10-CM

## 2017-11-19 NOTE — Progress Notes (Signed)
11/19/2017 Catherine [name]   1955-07-12  [id number]  Primary Physician Myrtis Ser, CNM Primary Cardiologist: Dr. Irish Lack  Reason for Visit/CC: 1 yr f/u for PAF  HPI:  Catherine [name] is a 62 y.o. female who is being seen today for her yearly evaluation given history of PAF. Also has HLD, followed by PCP and on simvastatin. Dr. Irish Lack has been following her for PAF. She is on rate control with metoprolol. She is not on anticoagulation given CHA2DS2 VASc score of only 1 (female sex). She is on ASA 81 mg daily. No other cardiac issues.   She reports she has been doing well since last OV. She has occasional episodes of palpitations that are stress/ anxiety related but denies any prolonged symptoms. Fully compliant with metoprolol and ASA. EKG today shows NSR, HR 73. She denies CP and dyspnea.   Current Meds  Medication Sig  . aspirin EC 81 MG tablet Take 1 tablet (81 mg total) by mouth daily.  Marland Kitchen buPROPion (WELLBUTRIN SR) 150 MG 12 hr tablet Take 300 mg by mouth daily.  . Calcium Carbonate-Vitamin D (CALCIUM-VITAMIN D) 500-200 MG-UNIT tablet Take 1 tablet by mouth daily.  Marland Kitchen CALCIUM-MAGNESIUM PO Take 1,000 mg by mouth daily.   Marland Kitchen escitalopram (LEXAPRO) 20 MG tablet Take 20 mg by mouth daily.   . fish oil-omega-3 fatty acids 1000 MG capsule Take 1 g by mouth daily.   . metoprolol succinate (TOPROL-XL) 25 MG 24 hr tablet Take 1 tablet (25 mg total) by mouth daily. MUST ATTEND APPOINTMENT  . omeprazole (PRILOSEC) 10 MG capsule Take 10 mg by mouth daily.  . simvastatin (ZOCOR) 5 MG tablet Take 5 mg by mouth at bedtime.   Allergies  Allergen Reactions  . Codeine Nausea And Vomiting  . Dairy Aid [Lactase] Other (See Comments)    Burns throat and makes pt. Head feel stuffy with headache  . Nsaids Other (See Comments)    Has blood clot syndrome  . Other Other (See Comments)    Patient is allergic to nuts, it gives her headache   . Ciprofloxacin Hcl Swelling and Rash   Past Medical  History:  Diagnosis Date  . Anti-phospholipid syndrome ([place])   . Atrial fibrillation ([place])    on asa 325  . Complication of anesthesia   . Dysrhythmia    hx. PAF(paroxysmal Atrial Fib)  . Generalized osteoarthrosis, unspecified site   . GERD (gastroesophageal reflux disease)   . Major depression   . Mixed hyperlipidemia   . PAF (paroxysmal atrial fibrillation) ([place])   . Panic disorder   . PONV (postoperative nausea and vomiting)    Family History  Problem Relation Age of Onset  . Cancer Father        colon  . Hypertension Brother   . Hyperlipidemia Brother   . Diabetes Maternal Grandmother   . Cancer Maternal Grandmother        liver  . Cancer Maternal Grandfather        colon and lung  . Hyperlipidemia Brother   . Hypertension Brother   . Heart attack Neg Hx    Past Surgical History:  Procedure Laterality Date  . COLONOSCOPY WITH PROPOFOL N/A 06/09/2013   Procedure: COLONOSCOPY WITH PROPOFOL;  Surgeon: Garlan Fair, MD;  Location: [facility];  Service: Endoscopy;  Laterality: N/A;  . DIAGNOSTIC LAPAROSCOPY     fibroid removal  . ESOPHAGOGASTRODUODENOSCOPY (EGD) WITH PROPOFOL N/A 06/09/2013   Procedure: ESOPHAGOGASTRODUODENOSCOPY (EGD) WITH PROPOFOL;  Surgeon:  Garlan Fair, MD;  Location: [facility];  Service: Endoscopy;  Laterality: N/A;  . TONSILLECTOMY    . TOTAL ABDOMINAL HYSTERECTOMY W/ BILATERAL SALPINGOOPHORECTOMY     fibroids   Social History   Socioeconomic History  . Marital status: Married    Spouse name: Not on file  . Number of children: Not on file  . Years of education: Not on file  . Highest education level: Not on file  Occupational History  . Not on file  Social Needs  . Financial resource strain: Not on file  . Food insecurity:    Worry: Not on file    Inability: Not on file  . Transportation needs:    Medical: Not on file    Non-medical: Not on file  Tobacco Use  . Smoking status: Never Smoker  . Smokeless tobacco: Never  Used  Substance and Sexual Activity  . Alcohol use: No  . Drug use: No  . Sexual activity: Not on file  Lifestyle  . Physical activity:    Days per week: Not on file    Minutes per session: Not on file  . Stress: Not on file  Relationships  . Social connections:    Talks on phone: Not on file    Gets together: Not on file    Attends religious service: Not on file    Active member of club or organization: Not on file    Attends meetings of clubs or organizations: Not on file    Relationship status: Not on file  . Intimate partner violence:    Fear of current or ex partner: Not on file    Emotionally abused: Not on file    Physically abused: Not on file    Forced sexual activity: Not on file  Other Topics Concern  . Not on file  Social History Narrative  . Not on file     Review of Systems: General: negative for chills, fever, night sweats or weight changes.  Cardiovascular: negative for chest pain, dyspnea on exertion, edema, orthopnea, palpitations, paroxysmal nocturnal dyspnea or shortness of breath Dermatological: negative for rash Respiratory: negative for cough or wheezing Urologic: negative for hematuria Abdominal: negative for nausea, vomiting, diarrhea, bright red blood per rectum, melena, or hematemesis Neurologic: negative for visual changes, syncope, or dizziness All other systems reviewed and are otherwise negative except as noted above.   Physical Exam:  Blood pressure 120/80, pulse 73, height 5\' 5"  (1.651 m), weight 173 lb 6.4 oz (78.7 kg).  General appearance: alert, cooperative and no distress Neck: no carotid bruit and no JVD Lungs: clear to auscultation bilaterally Heart: regular rate and rhythm, S1, S2 normal, no murmur, click, rub or gallop Extremities: extremities normal, atraumatic, no cyanosis or edema Pulses: 2+ and symmetric Skin: Skin color, texture, turgor normal. No rashes or lesions Neurologic: Grossly normal  EKG NSR 73 bpm-- personally  reviewed   ASSESSMENT AND PLAN:   1. PAF: currently NSR. HR controled w/ BB. CHA2DS2 VASc score is only 1 for female sex. No indication for anticoagulation at this time. Continue ASA 81 mg. Continue metoprolol for control of HR. F/u in 1 year or sooner if needed.   2. HLD: recent lipid panel showed borderline elevated LDL at 105. On simvastatin. Followed by PCP. I encouraged increasing physical activity and low fat diet.   Follow-Up in 1 year w/ Dr. Roque Cash Ladoris Gene, [facility] 11/19/2017 10:33 AM

## 2017-11-19 NOTE — Patient Instructions (Signed)
Your physician recommends that you continue on your current medications as directed. Please refer to the Current Medication list given to you today.  Your physician wants you to follow-up in: 1 year with Dr. Varanasi. You will receive a reminder letter in the mail two months in advance. If you don't receive a letter, please call our office to schedule the follow-up appointment.
